# Patient Record
Sex: Female | Born: 1970 | Race: Black or African American | Hispanic: No | Marital: Married
Health system: Midwestern US, Community
[De-identification: ages and names within clinical notes are randomized; demographics above are authoritative.]

## PROBLEM LIST (undated history)

## (undated) DIAGNOSIS — C50912 Malignant neoplasm of unspecified site of left female breast: Principal | ICD-10-CM

## (undated) DIAGNOSIS — Z780 Asymptomatic menopausal state: Secondary | ICD-10-CM

## (undated) DIAGNOSIS — C779 Secondary and unspecified malignant neoplasm of lymph node, unspecified: Secondary | ICD-10-CM

## (undated) DIAGNOSIS — R0600 Dyspnea, unspecified: Secondary | ICD-10-CM

## (undated) DIAGNOSIS — R208 Other disturbances of skin sensation: Secondary | ICD-10-CM

## (undated) DIAGNOSIS — Z170 Estrogen receptor positive status [ER+]: Secondary | ICD-10-CM

## (undated) DIAGNOSIS — C50512 Malignant neoplasm of lower-outer quadrant of left female breast: Principal | ICD-10-CM

## (undated) DIAGNOSIS — Z853 Personal history of malignant neoplasm of breast: Secondary | ICD-10-CM

## (undated) DIAGNOSIS — R928 Other abnormal and inconclusive findings on diagnostic imaging of breast: Secondary | ICD-10-CM

## (undated) DIAGNOSIS — J45909 Unspecified asthma, uncomplicated: Secondary | ICD-10-CM

## (undated) DIAGNOSIS — C801 Malignant (primary) neoplasm, unspecified: Secondary | ICD-10-CM

## (undated) DIAGNOSIS — Z1231 Encounter for screening mammogram for malignant neoplasm of breast: Secondary | ICD-10-CM

## (undated) DIAGNOSIS — C50412 Malignant neoplasm of upper-outer quadrant of left female breast: Secondary | ICD-10-CM

## (undated) DIAGNOSIS — C50919 Malignant neoplasm of unspecified site of unspecified female breast: Secondary | ICD-10-CM

## (undated) DIAGNOSIS — R918 Other nonspecific abnormal finding of lung field: Secondary | ICD-10-CM

## (undated) HISTORY — PX: BREAST LUMPECTOMY: SHX2

---

## 2016-01-01 ENCOUNTER — Inpatient Hospital Stay: Admit: 2016-01-01

## 2016-01-01 ENCOUNTER — Encounter

## 2016-01-01 DIAGNOSIS — Z1231 Encounter for screening mammogram for malignant neoplasm of breast: Secondary | ICD-10-CM

## 2016-01-30 ENCOUNTER — Encounter

## 2016-02-03 ENCOUNTER — Inpatient Hospital Stay: Admit: 2016-02-03 | Attending: Specialist

## 2016-02-03 ENCOUNTER — Encounter

## 2016-02-03 DIAGNOSIS — R928 Other abnormal and inconclusive findings on diagnostic imaging of breast: Secondary | ICD-10-CM

## 2016-03-09 DIAGNOSIS — R928 Other abnormal and inconclusive findings on diagnostic imaging of breast: Secondary | ICD-10-CM

## 2016-03-10 ENCOUNTER — Inpatient Hospital Stay: Admit: 2016-03-10 | Payer: Self-pay

## 2016-03-17 ENCOUNTER — Encounter

## 2016-03-26 ENCOUNTER — Inpatient Hospital Stay: Admit: 2016-03-26 | Payer: PRIVATE HEALTH INSURANCE | Attending: Specialist

## 2016-03-26 DIAGNOSIS — C50912 Malignant neoplasm of unspecified site of left female breast: Secondary | ICD-10-CM

## 2016-03-26 MED ORDER — GADOBUTROL 1 MMOL/ML (604.72 MG/ML) IV
1 mmol/mL (604.72 mg/mL) | Freq: Once | INTRAVENOUS | Status: AC
Start: 2016-03-26 — End: 2016-03-26
  Administered 2016-03-26: 18:00:00 via INTRAVENOUS

## 2016-03-26 MED FILL — GADAVIST 1 MMOL/ML (604.72 MG/ML) INTRAVENOUS SOLUTION: 1 mmol/mL (604.72 mg/mL) | INTRAVENOUS | Qty: 7

## 2016-03-31 ENCOUNTER — Encounter

## 2016-04-03 ENCOUNTER — Inpatient Hospital Stay: Admit: 2016-04-03 | Payer: Self-pay

## 2016-04-03 DIAGNOSIS — C50912 Malignant neoplasm of unspecified site of left female breast: Secondary | ICD-10-CM

## 2016-04-03 LAB — METABOLIC PANEL, COMPREHENSIVE
ALT (SGPT): 14 U/L (ref 12–78)
AST (SGOT): 12 U/L — ABNORMAL LOW (ref 15–37)
Albumin: 3.6 gm/dl (ref 3.4–5.0)
Alk. phosphatase: 60 U/L (ref 45–117)
BUN: 10 mg/dl (ref 7–25)
Bilirubin, total: 0.4 mg/dl (ref 0.2–1.0)
CO2: 21 mEq/L (ref 21–32)
Calcium: 9.1 mg/dl (ref 8.5–10.1)
Chloride: 109 mEq/L — ABNORMAL HIGH (ref 98–107)
Creatinine: 0.8 mg/dl (ref 0.6–1.3)
GFR est AA: >60
GFR est non-AA: >60
Glucose: 78 mg/dl (ref 74–106)
Potassium: 4.5 mEq/L (ref 3.5–5.1)
Protein, total: 7.9 gm/dl (ref 6.4–8.2)
Sodium: 140 mEq/L (ref 136–145)

## 2016-04-04 LAB — CBC WITH AUTOMATED DIFF
BASOPHILS: 0.8 % (ref 0–3)
EOSINOPHILS: 1.7 % (ref 0–5)
HCT: 26.1 % — ABNORMAL LOW (ref 37.0–50.0)
HGB: 7.7 gm/dl — ABNORMAL LOW (ref 13.0–17.2)
IMMATURE GRANULOCYTES: 0.2 % (ref 0.0–3.0)
LYMPHOCYTES: 38.6 % (ref 28–48)
MCH: 17.2 pg — ABNORMAL LOW (ref 25.4–34.6)
MCHC: 29.5 gm/dl — ABNORMAL LOW (ref 30.0–36.0)
MCV: 58.4 fL — ABNORMAL LOW (ref 80.0–98.0)
MONOCYTES: 3.8 % (ref 1–13)
MPV: 9.4 fL (ref 6.0–10.0)
NEUTROPHILS: 54.9 % (ref 34–64)
NRBC: 0 (ref 0–0)
PLATELET: 649 10*3/uL — ABNORMAL HIGH (ref 140–450)
RBC: 4.47 M/uL (ref 3.60–5.20)
RDW-SD: 38.4 (ref 36.4–46.3)
WBC: 4.7 10*3/uL (ref 4.0–11.0)

## 2016-04-06 LAB — CANCER ANTIGEN (CA) 15-3: Cancer Antigen 15-3: 16 U/mL (ref <32)

## 2016-04-15 LAB — CA 27.29

## 2016-04-16 ENCOUNTER — Inpatient Hospital Stay: Admit: 2016-04-16 | Payer: Self-pay | Attending: Specialist

## 2016-04-16 ENCOUNTER — Encounter

## 2016-04-16 ENCOUNTER — Inpatient Hospital Stay: Payer: PRIVATE HEALTH INSURANCE

## 2016-04-16 ENCOUNTER — Ambulatory Visit: Admit: 2016-04-16 | Payer: PRIVATE HEALTH INSURANCE

## 2016-04-16 ENCOUNTER — Ambulatory Visit

## 2016-04-16 DIAGNOSIS — C50412 Malignant neoplasm of upper-outer quadrant of left female breast: Secondary | ICD-10-CM

## 2016-04-16 LAB — POC CHEM8
BUN: 5 mg/dl — ABNORMAL LOW (ref 7–25)
CALCIUM,IONIZED: 4.9 mg/dL (ref 4.40–5.40)
CO2, TOTAL: 23 mmol/L (ref 21–32)
Chloride: 105 mEq/L (ref 98–107)
Creatinine: 0.8 mg/dl (ref 0.6–1.3)
Glucose: 82 mg/dL (ref 74–106)
HCT: 29 % — ABNORMAL LOW (ref 38–45)
HGB: 9.9 gm/dl — ABNORMAL LOW (ref 13.0–17.2)
Potassium: 3.7 mEq/L (ref 3.5–4.9)
Sodium: 139 mEq/L (ref 136–145)

## 2016-04-16 LAB — POC HCG,URINE: HCG urine, QL: NEGATIVE

## 2016-04-16 MED ORDER — PROMETHAZINE 25 MG/ML INJECTION
25 mg/mL | INTRAMUSCULAR | Status: AC
Start: 2016-04-16 — End: 2016-04-16
  Administered 2016-04-16: 22:00:00 via INTRAMUSCULAR

## 2016-04-16 MED ORDER — TECHNETIUM TC 99M TILMANOCEPT IV KIT
Freq: Once | Status: AC
Start: 2016-04-16 — End: 2016-04-16
  Administered 2016-04-16: 16:00:00 via SUBCUTANEOUS

## 2016-04-16 MED ORDER — ONDANSETRON (PF) 4 MG/2 ML INJECTION
4 mg/2 mL | INTRAMUSCULAR | Status: AC
Start: 2016-04-16 — End: 2016-04-16
  Administered 2016-04-16: 22:00:00 via INTRAVENOUS

## 2016-04-16 MED ORDER — MIDAZOLAM 1 MG/ML IJ SOLN
1 mg/mL | INTRAMUSCULAR | Status: AC
Start: 2016-04-16 — End: ?

## 2016-04-16 MED ORDER — PROPOFOL 10 MG/ML IV EMUL
10 mg/mL | INTRAVENOUS | Status: AC
Start: 2016-04-16 — End: ?

## 2016-04-16 MED ORDER — SCOPOLAMINE (1.3-1.5) MG 72 HR TRANSDERM PATCH
1 mg over 3 days | TRANSDERMAL | Status: DC
Start: 2016-04-16 — End: 2016-04-16

## 2016-04-16 MED ORDER — MORPHINE 8 MG/ML SYRINGE
8 mg/mL | INTRAMUSCULAR | Status: DC | PRN
Start: 2016-04-16 — End: 2016-04-16

## 2016-04-16 MED ORDER — LIDOCAINE HCL 1 % (10 MG/ML) IJ SOLN
10 mg/mL (1 %) | Freq: Once | INTRAMUSCULAR | Status: AC
Start: 2016-04-16 — End: 2016-04-16
  Administered 2016-04-16: 15:00:00 via INTRADERMAL

## 2016-04-16 MED ORDER — FENTANYL CITRATE (PF) 50 MCG/ML IJ SOLN
50 mcg/mL | INTRAMUSCULAR | Status: DC | PRN
Start: 2016-04-16 — End: 2016-04-16
  Administered 2016-04-16 (×3): via INTRAVENOUS

## 2016-04-16 MED ORDER — OXYCODONE 5 MG TAB
5 mg | ORAL | Status: DC | PRN
Start: 2016-04-16 — End: 2016-04-16

## 2016-04-16 MED ORDER — FENTANYL CITRATE (PF) 50 MCG/ML IJ SOLN
50 mcg/mL | INTRAMUSCULAR | Status: AC
Start: 2016-04-16 — End: ?

## 2016-04-16 MED ORDER — KETOROLAC TROMETHAMINE 30 MG/ML INJECTION
30 mg/mL (1 mL) | INTRAMUSCULAR | Status: AC
Start: 2016-04-16 — End: ?

## 2016-04-16 MED ORDER — APREPITANT 80 MG CAP
80 mg | Freq: Once | ORAL | Status: DC
Start: 2016-04-16 — End: 2016-04-16
  Administered 2016-04-17: via ORAL

## 2016-04-16 MED ORDER — LABETALOL 5 MG/ML IV SOLN
5 mg/mL | INTRAVENOUS | Status: DC | PRN
Start: 2016-04-16 — End: 2016-04-16

## 2016-04-16 MED ORDER — ONDANSETRON (PF) 4 MG/2 ML INJECTION
4 mg/2 mL | INTRAMUSCULAR | Status: AC
Start: 2016-04-16 — End: ?

## 2016-04-16 MED ORDER — DIPHENHYDRAMINE HCL 50 MG/ML IJ SOLN
50 mg/mL | INTRAMUSCULAR | Status: AC
Start: 2016-04-16 — End: ?

## 2016-04-16 MED ORDER — PROPOFOL 10 MG/ML IV EMUL
10 mg/mL | INTRAVENOUS | Status: DC | PRN
Start: 2016-04-16 — End: 2016-04-16
  Administered 2016-04-16: 20:00:00 via INTRAVENOUS

## 2016-04-16 MED ORDER — DEXAMETHASONE SODIUM PHOSPHATE 4 MG/ML IJ SOLN
4 mg/mL | INTRAMUSCULAR | Status: DC | PRN
Start: 2016-04-16 — End: 2016-04-16
  Administered 2016-04-16: 20:00:00 via INTRAVENOUS

## 2016-04-16 MED ORDER — KETAMINE 50 MG/ML IJ SOLN
50 mg/mL | INTRAMUSCULAR | Status: DC | PRN
Start: 2016-04-16 — End: 2016-04-16
  Administered 2016-04-16 (×2): via INTRAVENOUS

## 2016-04-16 MED ORDER — HYDROCODONE-ACETAMINOPHEN 5 MG-325 MG TAB
5-325 mg | ORAL_TABLET | ORAL | 0 refills | Status: DC | PRN
Start: 2016-04-16 — End: 2017-04-01

## 2016-04-16 MED ORDER — NALOXONE 0.4 MG/ML INJECTION
0.4 mg/mL | INTRAMUSCULAR | Status: DC | PRN
Start: 2016-04-16 — End: 2016-04-16

## 2016-04-16 MED ORDER — ONDANSETRON (PF) 4 MG/2 ML INJECTION
4 mg/2 mL | INTRAMUSCULAR | Status: DC | PRN
Start: 2016-04-16 — End: 2016-04-16
  Administered 2016-04-16: 20:00:00 via INTRAVENOUS

## 2016-04-16 MED ORDER — LACTATED RINGERS IV
INTRAVENOUS | Status: DC | PRN
Start: 2016-04-16 — End: 2016-04-16
  Administered 2016-04-16: 20:00:00 via INTRAVENOUS

## 2016-04-16 MED ORDER — KETOROLAC TROMETHAMINE 30 MG/ML INJECTION
30 mg/mL (1 mL) | INTRAMUSCULAR | Status: DC | PRN
Start: 2016-04-16 — End: 2016-04-16
  Administered 2016-04-16: 21:00:00 via INTRAVENOUS

## 2016-04-16 MED ORDER — ONDANSETRON (PF) 4 MG/2 ML INJECTION
4 mg/2 mL | Freq: Once | INTRAMUSCULAR | Status: AC
Start: 2016-04-16 — End: 2016-04-16
  Administered 2016-04-16: 22:00:00 via INTRAVENOUS

## 2016-04-16 MED ORDER — FENTANYL CITRATE (PF) 50 MCG/ML IJ SOLN
50 mcg/mL | INTRAMUSCULAR | Status: DC | PRN
Start: 2016-04-16 — End: 2016-04-16
  Administered 2016-04-16 (×2): via INTRAVENOUS

## 2016-04-16 MED ORDER — GUM MASTIC-STORAX-METHYLSALICYLATE-ALCOHOL IN A DROPPERETTE
Status: AC
Start: 2016-04-16 — End: ?

## 2016-04-16 MED ORDER — FLUMAZENIL 0.1 MG/ML IV SOLN
0.1 mg/mL | INTRAVENOUS | Status: DC | PRN
Start: 2016-04-16 — End: 2016-04-16

## 2016-04-16 MED ORDER — MEPERIDINE (PF) 25 MG/ML INJ SOLUTION
25 mg/ml | INTRAMUSCULAR | Status: DC | PRN
Start: 2016-04-16 — End: 2016-04-16

## 2016-04-16 MED ORDER — DIPHENHYDRAMINE HCL 50 MG/ML IJ SOLN
50 mg/mL | INTRAMUSCULAR | Status: DC | PRN
Start: 2016-04-16 — End: 2016-04-16
  Administered 2016-04-16: 20:00:00 via INTRAVENOUS

## 2016-04-16 MED ORDER — DEXAMETHASONE SODIUM PHOSPHATE 4 MG/ML IJ SOLN
4 mg/mL | INTRAMUSCULAR | Status: AC
Start: 2016-04-16 — End: ?

## 2016-04-16 MED ORDER — BUPIVACAINE-EPINEPHRINE (PF) 0.25 %-1:200,000 IJ SOLN
0.25 %-1:200,000 | INTRAMUSCULAR | Status: AC
Start: 2016-04-16 — End: ?

## 2016-04-16 MED ORDER — CLINDAMYCIN IN D5W 900 MG/50 ML IV PIGGY BACK
900 mg/50 mL | Freq: Once | INTRAVENOUS | Status: AC
Start: 2016-04-16 — End: 2016-04-16
  Administered 2016-04-16: 20:00:00 via INTRAVENOUS

## 2016-04-16 MED ORDER — LIDOCAINE HCL 1 % (10 MG/ML) IJ SOLN
10 mg/mL (1 %) | INTRAMUSCULAR | Status: DC | PRN
Start: 2016-04-16 — End: 2016-04-16
  Administered 2016-04-16: 20:00:00 via SUBCUTANEOUS

## 2016-04-16 MED ORDER — PROMETHAZINE IN NS 12.5 MG/50 ML IV PIGGY BAG
12.5 mg/50 ml | INTRAVENOUS | Status: DC
Start: 2016-04-16 — End: 2016-04-16

## 2016-04-16 MED ORDER — KETAMINE 50 MG/ML (1 ML) INTRAVENOUS SYRINGE
50 mg/mL (1 mL) | INTRAVENOUS | Status: AC
Start: 2016-04-16 — End: ?

## 2016-04-16 MED ORDER — OXYCODONE 5 MG TAB
5 mg | Freq: Once | ORAL | Status: DC | PRN
Start: 2016-04-16 — End: 2016-04-16

## 2016-04-16 MED ORDER — MIDAZOLAM 1 MG/ML IJ SOLN
1 mg/mL | INTRAMUSCULAR | Status: DC | PRN
Start: 2016-04-16 — End: 2016-04-16
  Administered 2016-04-16: 20:00:00 via INTRAVENOUS

## 2016-04-16 MED ORDER — LACTATED RINGERS IV
INTRAVENOUS | Status: DC
Start: 2016-04-16 — End: 2016-04-16
  Administered 2016-04-16: 22:00:00 via INTRAVENOUS

## 2016-04-16 MED ORDER — HYDRALAZINE 20 MG/ML IJ SOLN
20 mg/mL | INTRAMUSCULAR | Status: DC | PRN
Start: 2016-04-16 — End: 2016-04-16

## 2016-04-16 MED ORDER — LIDOCAINE HCL 1 % (10 MG/ML) IJ SOLN
10 mg/mL (1 %) | INTRAMUSCULAR | Status: AC
Start: 2016-04-16 — End: ?

## 2016-04-16 MED FILL — CLINDAMYCIN IN D5W 900 MG/50 ML IV PIGGY BACK: 900 mg/50 mL | INTRAVENOUS | Qty: 50

## 2016-04-16 MED FILL — MIDAZOLAM 1 MG/ML IJ SOLN: 1 mg/mL | INTRAMUSCULAR | Qty: 2

## 2016-04-16 MED FILL — BUPIVACAINE-EPINEPHRINE (PF) 0.25 %-1:200,000 IJ SOLN: 0.25 %-1:200,000 | INTRAMUSCULAR | Qty: 30

## 2016-04-16 MED FILL — LIDOCAINE HCL 1 % (10 MG/ML) IJ SOLN: 10 mg/mL (1 %) | INTRAMUSCULAR | Qty: 20

## 2016-04-16 MED FILL — PROMETHAZINE 25 MG/ML INJECTION: 25 mg/mL | INTRAMUSCULAR | Qty: 1

## 2016-04-16 MED FILL — FENTANYL CITRATE (PF) 50 MCG/ML IJ SOLN: 50 mcg/mL | INTRAMUSCULAR | Qty: 2

## 2016-04-16 MED FILL — GUM MASTIC-STORAX-METHYLSALICYLATE-ALCOHOL IN A DROPPERETTE: Qty: 1

## 2016-04-16 MED FILL — ONDANSETRON (PF) 4 MG/2 ML INJECTION: 4 mg/2 mL | INTRAMUSCULAR | Qty: 2

## 2016-04-16 MED FILL — TRANSDERM-SCOP 1 MG OVER 3 DAYS TRANSDERMAL PATCH: 1 mg over 3 days | TRANSDERMAL | Qty: 1

## 2016-04-16 MED FILL — DIPRIVAN 10 MG/ML INTRAVENOUS EMULSION: 10 mg/mL | INTRAVENOUS | Qty: 20

## 2016-04-16 MED FILL — KETOROLAC TROMETHAMINE 30 MG/ML INJECTION: 30 mg/mL (1 mL) | INTRAMUSCULAR | Qty: 1

## 2016-04-16 MED FILL — DIPHENHYDRAMINE HCL 50 MG/ML IJ SOLN: 50 mg/mL | INTRAMUSCULAR | Qty: 1

## 2016-04-16 MED FILL — LACTATED RINGERS IV: INTRAVENOUS | Qty: 1000

## 2016-04-16 MED FILL — DEXAMETHASONE SODIUM PHOSPHATE 4 MG/ML IJ SOLN: 4 mg/mL | INTRAMUSCULAR | Qty: 5

## 2016-04-16 MED FILL — KETAMINE 50 MG/ML (1 ML) INTRAVENOUS SYRINGE: 50 mg/mL (1 mL) | INTRAVENOUS | Qty: 1

## 2016-04-16 NOTE — Other (Signed)
Patient's husband updated on PACU status.

## 2016-04-16 NOTE — Other (Signed)
Assisting pt while getting dressed. No further bleeding noted. Dermabond intact.

## 2016-04-16 NOTE — Op Note (Signed)
[facility]  Operation Report  NAME:  Carlson, Lynn  SEX:   F  DATE: 04/16/2016  DOB: 01/06/1971  MR#    [id number]  ROOM:  PL  ACCT#  [id number]        SURGEON:  Criss Alvine, MD    PREOPERATIVE DIAGNOSIS:  Left breast carcinoma.    POSTOPERATIVE DIAGNOSIS:  Left breast carcinoma.    PROCEDURES:  1.  Left partial mastectomy with needle localization.  2.  Sentinel node biopsy.    ANESTHESIA:  General.    COMPLICATIONS:  None.    CONDITION:  Stable to recovery.    PROCEDURE:  The patient was taken to the operating room and placed in supine position upon the operating table.  After adequate induction of general anesthetic, the patient was prepped and draped in the usual sterile fashion.  The patient was noted to have point uptake of the radio tracer in the axilla.  Axillary incision was made using a #15 blade.  Subcutaneous tissue was divided using electrocautery.  The dissection was taken down to the axillary fascia, which was carefully opened.  At this point, the Neoprobe identified 2 lymph nodes with elevated uptake.  These were dissected free from the surrounding structures, clamped using 3-0 Vicryl ties and sent for pathological evaluation with frozen section analysis.  Attention was turned to the breast.  A periareolar incision was made using a #15 blade.  Subcutaneous tissue was divided using electrocautery.  The wire was delivered into the wound.  This area was grasped with Allis clamps and dissected free from the surrounding structures using Metzenbaum scissors dissection.  The specimen was oriented with sutures and x-ray analysis revealed that the clip and wire were incorporated within the specimen.  The patient underwent reexcision of the margins.  Each margin was carefully reexcised and marked with ink.  The area was anesthetized with0.5% Marcaine.  Clips were placed at the borders of resection.  The wound was irrigated with normal saline.  Hemostasis was achieved using  electrocautery.  Breast tissue was approximated using 3-0 Vicryl and the skin was closed using 4-0 Vicryl subcuticular closure.  At this time, the pathologist reported that the sentinel nodes were negative for metastatic disease.  The subcutaneous tissues were approximated using 3-0 Vicryl, and the skin was closed using 4-0 Vicryl subcuticular closure.  Both wounds were covered with Dermabond.  Sponge, needle and instrument count was correct.      ___________________  Marvene Staff MD  Dictated By:.   [facility]  D:04/16/2016 23:08:01  T: 04/16/2016 23:24:05  [id number]

## 2016-04-16 NOTE — H&P (Signed)
Date of Surgery Update:  Lynn Carlson was seen and examined.  History and physical has been reviewed. The patient has been examined. There have been no significant clinical changes since the completion of the originally dated History and Physical.    Signed By: Marvene Staff, MD     April 16, 2016 2:12 PM

## 2016-04-16 NOTE — Other (Signed)
While taking EKG leads off pt, pt noticed bleeding from left areola at bottom of incision. Noticed small open area bleeding. Called and notified Dr. Vashti Hey. Order obtained. Pressure held to area for 5 minutes. Dermabond to site and let dried. Pt tolerated well.

## 2016-04-16 NOTE — H&P (Signed)
Breast localization    History: localization for surgery for suspicious lesion    PE:  NAD  RRR  CTA bilaterally    Impression:   Suspicious breast lesion for localization.    Plan:  Breast localization for surgery.  Please see full radiology report pending pathology.

## 2016-04-16 NOTE — Discharge Summary (Signed)
Breast localization procedure performed without complications.  Please see full radiology report pending pathology.  Transferred to surgery.  Follow up with referring provider regarding pathology and when to resume standing medications.

## 2016-04-16 NOTE — Procedures (Signed)
Breast localization procedure was tolerated well without complications.  Please see full radiology report pending pathology.

## 2016-04-16 NOTE — Anesthesia Post-Procedure Evaluation (Signed)
Post-Anesthesia Evaluation and Assessment    Patient: METTA PINGREE MRN: [id number]  SSN: [id number]    Date of Birth: 10/03/1970  Age: 46 y.o.  Sex: female       Cardiovascular Function/Vital Signs  Visit Vitals   ??? BP 147/80   ??? Pulse (!) 114   ??? Temp 36.6 ??C (97.8 ??F)   ??? Resp 26   ??? Ht 5\' 9"  (1.753 m)   ??? Wt 85.9 kg (189 lb 6 oz)   ??? SpO2 100%   ??? BMI 27.97 kg/m2       Patient is status post general anesthesia for Procedure(s):  NEEDLE LOCALIZE LUMPECTOMY LEFT BREAST; SENTINEL NODE BIOPSY; POSSIBLE AXILLARY DISSECTION.    Nausea/Vomiting: None    Postoperative hydration reviewed and adequate.    Pain:  Pain Scale 1: Numeric (0 - 10) (04/16/16 1702)  Pain Intensity 1: 8 (04/16/16 1702)   Managed    Neurological Status:   Neuro (WDL): Within Defined Limits (04/16/16 1641)   At baseline    Mental Status and Level of Consciousness: Arousable    Pulmonary Status:   O2 Device: Room air (04/16/16 1641)   Adequate oxygenation and airway patent    Complications related to anesthesia: None    Post-anesthesia assessment completed. No concerns    Signed By: Avel Peace, MD     April 16, 2016

## 2016-04-16 NOTE — Other (Signed)
DR. Vashti Hey in, aware of nausea in pacu, pts family in.

## 2016-04-16 NOTE — Op Note (Signed)
[place]  Operation Report  NAME:  Lynn Carlson, Lynn Carlson  SEX:   F  DATE: 04/16/2016  DOB: 1970-06-27  MR#    [id number]  ROOM:  PL  ACCT#  [id number]        SURGEON:  Criss Alvine, MD    PREOPERATIVE DIAGNOSIS:  Left breast carcinoma.    POSTOPERATIVE DIAGNOSIS:  Left breast carcinoma.    PROCEDURES:  1.  Left partial mastectomy with needle localization.  2.  Sentinel node biopsy.    ANESTHESIA:  General.    COMPLICATIONS:  None.    CONDITION:  Stable to recovery.    PROCEDURE:  The patient was taken to the operating room and placed in supine position upon the operating table.  After adequate induction of general anesthetic, the patient was prepped and draped in the usual sterile fashion.  The patient was noted to have point uptake of the radio tracer in the axilla.  Axillary incision was made using a #15 blade.  Subcutaneous tissue was divided using electrocautery.  The dissection was taken down to the axillary fascia, which was carefully opened.  At this point, the Neoprobe identified 2 lymph nodes with elevated uptake.  These were dissected free from the surrounding structures, clamped using 3-0 Vicryl ties and sent for pathological evaluation with frozen section analysis.  Attention was turned to the breast.  A periareolar incision was made using a #15 blade.  Subcutaneous tissue was divided using electrocautery.  The wire was delivered into the wound.  This area was grasped with Allis clamps and dissected free from the surrounding structures using Metzenbaum scissors dissection.  The specimen was oriented with sutures and x-ray analysis revealed that the clip and wire were incorporated within the specimen.  The patient underwent reexcision of the margins.  Each margin was carefully reexcised and marked with ink.  The area was anesthetized with0.5% Marcaine.  Clips were placed at the borders of resection.  The wound was irrigated with normal saline.  Hemostasis was achieved using electrocautery.   Breast tissue was approximated using 3-0 Vicryl and the skin was closed using 4-0 Vicryl subcuticular closure.  At this time, the pathologist reported that the sentinel nodes were negative for metastatic disease.  The subcutaneous tissues were approximated using 3-0 Vicryl, and the skin was closed using 4-0 Vicryl subcuticular closure.  Both wounds were covered with Dermabond.  Sponge, needle and instrument count was correct.      ___________________  Marvene Staff MD  Dictated By:.   [facility]  D:04/16/2016 23:08:01  T: 04/16/2016 23:24:05  [id number]

## 2016-04-16 NOTE — Other (Addendum)
Patient vomited about 64ml green bile to clear secretions. Patient stated she and pain medicine does not go well together. Relief verbalized after vomiting.   [id number] Patient with another episode of vomiting. 49ml of green bile emesis noted. Dr Argentina Ponder made aware and will be here at bedside.  1734 Patient sleeping at present. After nausea medications. Left undisturbed.  1745 Sleeping after nausea intervention but arouses easily. Some relief of nausea but not all. No emesis at this time. Will continue to monitor.  1759 Woke up vomiting. Emesis of about 28ml green bile secretions. Went back to sleep after vomiting.

## 2016-04-16 NOTE — Other (Signed)
When awakened pt states relief of nausea, pt rates her pain 8/10 then drifts back to sleep. Pts family at stretcher side.

## 2016-04-16 NOTE — Anesthesia Pre-Procedure Evaluation (Addendum)
Anesthetic History   No history of anesthetic complications            Review of Systems / Medical History  Patient summary reviewed and pertinent labs reviewed    Pulmonary            Asthma : well controlled       Neuro/Psych   Within defined limits           Cardiovascular  Within defined limits                Exercise tolerance: >4 METS     GI/Hepatic/Renal  Within defined limits              Endo/Other        Anemia     Other Findings                 Anesthetic Plan    ASA: 2  Anesthesia type: general          Induction: Intravenous  Anesthetic plan and risks discussed with: Patient

## 2016-04-16 NOTE — Brief Op Note (Signed)
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 Visvardis  Surg Asst-Relief: Madilyn Fireman  No case tracking events are documented in the log.  Anesthesia: General   Estimated Blood Loss: 40 ml  Specimens:   ID Type Source Tests Collected by Time Destination   1 : left axillary lymph node  #26 Tissue Lymph Node AP HISTOLOGY Marvene Staff, MD 04/16/2016 1518    2 : Left Axillary Lymph Node #0 Tissue Lymph Node AP HISTOLOGY Marvene Staff, MD 04/16/2016 1519    3 : left breast tissue Tissue Breast AP HISTOLOGY Marvene Staff, MD 04/16/2016 1549    4 : left breast tissue anterior margin Tissue Breast AP HISTOLOGY Marvene Staff, MD 04/16/2016 1551    5 : left breast tissue medial margin Tissue Breast AP HISTOLOGY Marvene Staff, MD 04/16/2016 1552    6 : left breast tissue superior margin Tissue Breast AP HISTOLOGY Marvene Staff, MD 04/16/2016 1553    7 : left breast tissue lateral Tissue Breast AP HISTOLOGY Marvene Staff, MD 04/16/2016 1554    8 : left breast tissue inferior margin Tissue Breast AP HISTOLOGY Marvene Staff, MD 04/16/2016 1555       Findings: clip in specimen, neg node  X 2   Complications: none  Implants: * No implants in log *

## 2016-04-17 MED ORDER — GUM MASTIC-STORAX-METHYLSALICYLATE-ALCOHOL IN A DROPPERETTE
Status: AC
Start: 2016-04-17 — End: ?

## 2016-04-17 MED FILL — GUM MASTIC-STORAX-METHYLSALICYLATE-ALCOHOL IN A DROPPERETTE: Qty: 1

## 2016-04-17 MED FILL — EMEND 80 MG CAPSULE: 80 mg | ORAL | Qty: 1

## 2016-04-19 MED FILL — DIPHENHYDRAMINE HCL 50 MG/ML IJ SOLN: 50 mg/mL | INTRAMUSCULAR | Qty: 12.5

## 2016-04-19 MED FILL — KETOROLAC TROMETHAMINE 30 MG/ML INJECTION: 30 mg/mL (1 mL) | INTRAMUSCULAR | Qty: 30

## 2016-04-19 MED FILL — DEXAMETHASONE SODIUM PHOSPHATE 4 MG/ML IJ SOLN: 4 mg/mL | INTRAMUSCULAR | Qty: 8

## 2016-04-19 MED FILL — LACTATED RINGERS IV: INTRAVENOUS | Qty: 700

## 2016-04-19 MED FILL — KETAMINE 50 MG/ML (1 ML) INTRAVENOUS SYRINGE: 50 mg/mL (1 mL) | INTRAVENOUS | Qty: 1

## 2016-04-19 MED FILL — PROPOFOL 10 MG/ML IV EMUL: 10 mg/mL | INTRAVENOUS | Qty: 20

## 2016-04-19 MED FILL — LIDOCAINE HCL 1 % (10 MG/ML) IJ SOLN: 10 mg/mL (1 %) | INTRAMUSCULAR | Qty: 50

## 2016-11-12 ENCOUNTER — Encounter

## 2017-01-04 ENCOUNTER — Inpatient Hospital Stay: Payer: PRIVATE HEALTH INSURANCE | Attending: Specialist

## 2017-01-25 ENCOUNTER — Inpatient Hospital Stay: Admit: 2017-01-25 | Payer: Self-pay

## 2017-01-25 ENCOUNTER — Encounter

## 2017-01-25 DIAGNOSIS — N644 Mastodynia: Secondary | ICD-10-CM

## 2017-02-02 ENCOUNTER — Encounter

## 2017-02-08 ENCOUNTER — Inpatient Hospital Stay: Admit: 2017-02-08 | Payer: Self-pay | Attending: Specialist

## 2017-02-08 DIAGNOSIS — N6489 Other specified disorders of breast: Secondary | ICD-10-CM

## 2017-02-08 MED ORDER — GADOBUTROL 1 MMOL/ML (604.72 MG/ML) IV
1 mmol/mL (604.72 mg/mL) | Freq: Once | INTRAVENOUS | Status: AC
Start: 2017-02-08 — End: 2017-02-08
  Administered 2017-02-08: 16:00:00 via INTRAVENOUS

## 2017-02-08 MED FILL — GADAVIST 1 MMOL/ML (604.72 MG/ML) INTRAVENOUS SOLUTION: 1 mmol/mL (604.72 mg/mL) | INTRAVENOUS | Qty: 8

## 2017-04-01 ENCOUNTER — Emergency Department: Admit: 2017-04-01 | Payer: Self-pay

## 2017-04-01 ENCOUNTER — Inpatient Hospital Stay
Admit: 2017-04-01 | Discharge: 2017-04-02 | Disposition: A | Discharge status: Home / Self Care | Payer: Self-pay | Attending: Emergency Medicine

## 2017-04-01 ENCOUNTER — Emergency Department

## 2017-04-01 DIAGNOSIS — R0600 Dyspnea, unspecified: Secondary | ICD-10-CM

## 2017-04-01 LAB — METABOLIC PANEL, BASIC
Anion gap: 8 mmol/L (ref 5–15)
BUN: 8 mg/dl (ref 7–25)
CO2: 24 mEq/L (ref 21–32)
Calcium: 9.3 mg/dl (ref 8.5–10.1)
Chloride: 108 mEq/L — ABNORMAL HIGH (ref 98–107)
Creatinine: 1 mg/dl (ref 0.6–1.3)
GFR est AA: >60
GFR est non-AA: >60
Glucose: 85 mg/dl (ref 74–106)
Potassium: 2.9 mEq/L — CL (ref 3.5–5.1)
Sodium: 139 mEq/L (ref 136–145)

## 2017-04-01 LAB — CBC WITH AUTOMATED DIFF
BASOPHILS: 0.4 % (ref 0–3)
EOSINOPHILS: 1.2 % (ref 0–5)
HCT: 36.2 % — ABNORMAL LOW (ref 37.0–50.0)
HGB: 12.9 gm/dl — ABNORMAL LOW (ref 13.0–17.2)
IMMATURE GRANULOCYTES: 0.4 % (ref 0.0–3.0)
LYMPHOCYTES: 20.1 % — ABNORMAL LOW (ref 28–48)
MCH: 30.4 pg (ref 25.4–34.6)
MCHC: 35.6 gm/dl (ref 30.0–36.0)
MCV: 85.4 fL (ref 80.0–98.0)
MONOCYTES: 3.9 % (ref 1–13)
MPV: 9.8 fL (ref 6.0–10.0)
NEUTROPHILS: 74 % — ABNORMAL HIGH (ref 34–64)
NRBC: 0 (ref 0–0)
PLATELET: 378 10*3/uL (ref 140–450)
RBC: 4.24 M/uL (ref 3.60–5.20)
RDW-SD: 42.5 (ref 36.4–46.3)
WBC: 7.4 10*3/uL (ref 4.0–11.0)

## 2017-04-01 LAB — TROPONIN I: Troponin-I: <0.015 ng/ml (ref 0.000–0.045)

## 2017-04-01 MED ORDER — DIPHENHYDRAMINE HCL 50 MG/ML IJ SOLN
50 mg/mL | INTRAMUSCULAR | Status: AC
Start: 2017-04-01 — End: 2017-04-01
  Administered 2017-04-01: via INTRAVENOUS

## 2017-04-01 MED ORDER — ONDANSETRON (PF) 4 MG/2 ML INJECTION
4 mg/2 mL | Freq: Once | INTRAMUSCULAR | Status: AC
Start: 2017-04-01 — End: 2017-04-01
  Administered 2017-04-01: 23:00:00 via INTRAVENOUS

## 2017-04-01 MED ORDER — POTASSIUM BICARBONATE-CITRIC ACID 25 MEQ EFFERVESCENT TAB
25 mEq | Freq: Two times a day (BID) | ORAL | Status: DC
Start: 2017-04-01 — End: 2017-04-02
  Administered 2017-04-01: 22:00:00 via ORAL

## 2017-04-01 MED ORDER — IOPAMIDOL 76 % IV SOLN
370 mg iodine /mL (76 %) | Freq: Once | INTRAVENOUS | Status: AC
Start: 2017-04-01 — End: 2017-04-01
  Administered 2017-04-01: via INTRAVENOUS

## 2017-04-01 MED ORDER — DEXAMETHASONE SODIUM PHOSPHATE 10 MG/ML IJ SOLN
10 mg/mL | Freq: Once | INTRAMUSCULAR | Status: DC
Start: 2017-04-01 — End: 2017-04-02

## 2017-04-01 MED FILL — ISOVUE-370  76 % INTRAVENOUS SOLUTION: 370 mg iodine /mL (76 %) | INTRAVENOUS | Qty: 80

## 2017-04-01 MED FILL — ONDANSETRON (PF) 4 MG/2 ML INJECTION: 4 mg/2 mL | INTRAMUSCULAR | Qty: 2

## 2017-04-01 MED FILL — DEXAMETHASONE SODIUM PHOSPHATE 10 MG/ML IJ SOLN: 10 mg/mL | INTRAMUSCULAR | Qty: 1

## 2017-04-01 MED FILL — POTASSIUM BICARBONATE-CITRIC ACID 25 MEQ EFFERVESCENT TAB: 25 mEq | ORAL | Qty: 2

## 2017-04-01 MED FILL — DIPHENHYDRAMINE HCL 50 MG/ML IJ SOLN: 50 mg/mL | INTRAMUSCULAR | Qty: 1

## 2017-04-01 NOTE — ED Provider Notes (Signed)
[place]  Emergency Department Treatment Report    Patient: Lynn Carlson Age: 47 y.o. Sex: female    Date of Birth: May 20, 1970 Admit Date: 04/01/2017 PCP: None   MRN: [id number]  CSN: [id number]  ATTENDING: Geradine Girt, MD   Room: ER30/ER30 Time Dictated: 4:55 PM APP: Hollie Salk, PA-C     Chief Complaint   Chief Complaint   Patient presents with   ??? Shortness of Breath       History of Present Illness   47 y.o. female with a history of breast cancer, on tamoxifen, presenting for evaluation of dyspnea with onset 5 days ago.  She denies chest pain, no cough or wheezing, reports that she has just been feeling increasingly winded, even with mild activity and with conversation.  She was at her oncologist's office today for a follow up and they urged her to come here for PE rule out.  Denies lower extremity swelling or pain.  No history of clots. Denies recent travel or immobilization.     Review of Systems   Constitutional:  No fever, chills, body aches  ENT: No sore throat or runny nose  Respiratory: +shortness of breath. No cough or wheezing  Cardiovascular: No chest pain or palpitations  Gastrointestinal: No abdominal pain, nausea, vomiting, diarrhea, or constipation  Genitourinary: No dysuria or frequency.  Musculoskeletal: No swelling or joint pain  Integumentary: No rashes or lesions  Hematologic: No bleeding/bruising complaints  Neurological: No headache or dizziness.    Past Medical/Surgical History     Past Medical History:   Diagnosis Date   ??? Asthma    ??? Breast CA ([place]) 2017   ??? Iron deficiency      Past Surgical History:   Procedure Laterality Date   ??? HX BREAST LUMPECTOMY Left 04/16/2016    NEEDLE LOCALIZE LUMPECTOMY LEFT BREAST; SENTINEL NODE BIOPSY; POSSIBLE AXILLARY DISSECTION performed by Marvene Staff, MD at [place] History     Social History     Socioeconomic History   ??? Marital status: MARRIED     Spouse name: Not on file    ??? Number of children: Not on file   ??? Years of education: Not on file   ??? Highest education level: Not on file   Tobacco Use   ??? Smoking status: Never Smoker   ??? Smokeless tobacco: Never Used   Substance and Sexual Activity   ??? Alcohol use: No   ??? Drug use: No   ??? Sexual activity: Yes       Family History     Family History   Problem Relation Age of Onset   ??? Cancer Father        Current Medications     Prior to Admission Medications   Prescriptions Last Dose Informant Patient Reported? Taking?   tamoxifen (NOLVADEX) 20 mg tablet   Yes Yes   Sig: Take 20 mg by mouth daily.      Facility-Administered Medications: None       Allergies     Allergies   Allergen Reactions   ??? Aspirin Not Reported This Time   ??? Onion Hives   ??? Penicillins Not Reported This Time       Physical Exam     ED Triage Vitals   ED Encounter Vitals Group      BP 04/01/17 1458 101/53      Pulse (Heart Rate) 04/01/17 1458 65  Resp Rate 04/01/17 1458 14      Temp 04/01/17 1514 97.7 ??F (36.5 ??C)      Temp src --       O2 Sat (%) 04/01/17 1458 100 %      Weight --       Height --      Constitutional: Pleasant female lying on stretcher, no distress.   HEENT: Conjunctiva clear.  PERRL. Mucous membranes moist, non-erythematous. Surface of the pharynx, palate, and tongue are pink, moist and without lesions.  Neck: supple, non tender, symmetrical, no masses, JVD, or lymphadenopathy   Respiratory: lungs clear to auscultation, nonlabored respirations. No tachypnea or accessory muscle use. She does get winded with short sentences.   Cardiovascular: heart regular rate and rhythm without murmur rubs or gallops.     Gastrointestinal:  Soft, non-tender, non-distended, normoactive bowel sounds  Musculoskeletal: Calves soft and non-tender. No peripheral edema or significant varicosities.  Pulses:  Radial and DP pulses 2+ and equal bilaterally.   Integumentary: warm and dry, no jaundice, no rashes or lesions     Impression and Management Plan    47 year old female on tamoxifen presenting for evaluation of dyspnea for the past 5 days.  On exam, O2 saturation is 100% on room air is within normal limits.  The lungs are clear to auscultation bilaterally, she has no peripheral edema no calf tenderness.  Will obtain CBC, BMP, troponin, chest x-ray, EKG, CTA chest to rule out PE.    Diagnostic Studies   Lab:   Recent Results (from the past 12 hour(s))   CBC WITH AUTOMATED DIFF    Collection Time: 04/01/17  3:27 PM   Result Value Ref Range    WBC 7.4 4.0 - 11.0 1000/mm3    RBC 4.24 3.60 - 5.20 M/uL    HGB 12.9 (L) 13.0 - 17.2 gm/dl    HCT 36.2 (L) 37.0 - 50.0 %    MCV 85.4 80.0 - 98.0 fL    MCH 30.4 25.4 - 34.6 pg    MCHC 35.6 30.0 - 36.0 gm/dl    PLATELET 378 140 - 450 1000/mm3    MPV 9.8 6.0 - 10.0 fL    RDW-SD 42.5 36.4 - 46.3      NRBC 0 0 - 0      IMMATURE GRANULOCYTES 0.4 0.0 - 3.0 %    NEUTROPHILS 74.0 (H) 34 - 64 %    LYMPHOCYTES 20.1 (L) 28 - 48 %    MONOCYTES 3.9 1 - 13 %    EOSINOPHILS 1.2 0 - 5 %    BASOPHILS 0.4 0 - 3 %   METABOLIC PANEL, BASIC    Collection Time: 04/01/17  3:30 PM   Result Value Ref Range    Sodium 139 136 - 145 mEq/L    Potassium 2.9 (LL) 3.5 - 5.1 mEq/L    Chloride 108 (H) 98 - 107 mEq/L    CO2 24 21 - 32 mEq/L    Glucose 85 74 - 106 mg/dl    BUN 8 7 - 25 mg/dl    Creatinine 1.0 0.6 - 1.3 mg/dl    GFR est AA >60.0      GFR est non-AA >60      Calcium 9.3 8.5 - 10.1 mg/dl    Anion gap 8 5 - 15 mmol/L   TROPONIN I    Collection Time: 04/01/17  3:30 PM   Result Value Ref Range    Troponin-I <0.015 0.000 - 0.045  ng/ml     Imaging:    Xr Chest Pa Lat    Result Date: 04/01/2017  EXAM: Frontal and lateral views of the chest. INDICATIONS: Dyspnea COMPARISON: Chest radiograph dated 04/16/2016. FINDINGS: No alveolar consolidations, congestive changes or pleural effusions. Cardiomediastinal silhouette normal in size and contour.     IMPRESSION: No acute cardiopulmonary process.      ECG: Reviewed by myself Dr. Oneita Hurt.  Sinus bradycardia, ventricular rate 59, PR interval 136, QRS 72, QTc 409.  No acute ischemic changes noted.    ED Course/Medical Decision Making   Patient's white blood cell count is normal at 7.4.  She is a very mild edema with a hemoglobin of 12.9 and hematocrit of 36.2.  Her potassium was found to be very low at 2.9, this was repleted orally during her stay in the ED.  Troponin is negative.  Renal function and remaining electrolytes all within normal limits.  Chest x-ray shows no acute cardiopulmonary process.  EKG sinus bradycardia without ischemic changes or T wave inversions noted.  Patient was transported for her CTA, upon arriving notified a CT tach that the patient gets hives with IV contrast in the past.  Nursing staff went to CT to medicate the patient with Decadron and Benadryl.    Patient was left in the care of Dr. Dyke Maes pending results of her CTA.    Continuation by Dr. Shirline Frees:  Patient signed out to me at change of the Dr. of Hermel[facility] Medicus down today.  Patient CT of the chest was negative for pulmonary embolism, pulmonary edema, or pneumonia.  Findings were discussed with the patient.  Unfortunately she has remained dyspneic.  Her O2 sats on room air are 100%.  She has no wheezing on exam.  We have a blighted the patient and her sats stayed around 98%.  I have offered the patient admission for echo and further workup, which she is declined.  I did review her other CT results with her including irregular density at the prior surgical site from her breast as well as her pulmonary nodules.  I have given her a copy of her CT, and I have recommended that she follow with Dr. Vashti Hey as soon as possible.  The patient declined admission and feels comfortable going home.  I have advised they return immediately with any worsening symptoms.  Patient discharged home in stable condition.          Final Diagnosis       ICD-10-CM ICD-9-CM    1. Dyspnea, unspecified type R06.00 786.09         Disposition   Patient discharged home in stable condition.     Dawn [place], PA-C  April 01, 2017    The patient was discussed with Dr. Hermelinda Medicus who agrees with the above assessment and plan.    My signature above authenticates this document and my orders, the final diagnosis (es), discharge prescription (s), and instructions in the Epic record. If you have any questions please contact [phone].  ??  Nursing notes have been reviewed by the physician/ advanced practice clinician.

## 2017-04-01 NOTE — ED Notes (Signed)
Pt lying calmly in bed, family at bedside, denies any needs at this time, no distress noted

## 2017-04-01 NOTE — ED Triage Notes (Signed)
Pt brought in by medics from oncologists office, went there for routine appt and they called ems due to patient having severe shortness of breath

## 2017-04-01 NOTE — ED Notes (Signed)
Bedside shift change report given to Catlyn (oncoming nurse) by Andee Poles (offgoing nurse). Report included the following information SBAR, ED Summary and MAR.

## 2017-04-01 NOTE — ED Notes (Signed)
Pregnancy test negative

## 2017-04-01 NOTE — ED Notes (Signed)
8:55 PM  04/01/17     Discharge instructions given to Lynn Carlson (name) with verbalization of understanding. Patient accompanied by spouse.  Patient discharged with the following prescriptions none. Patient discharged to home (destination).      Leeanne Deed, RN

## 2017-04-02 LAB — EKG, 12 LEAD, INITIAL
Atrial Rate: 59 {beats}/min
Calculated P Axis: 66 degrees
Calculated R Axis: 53 degrees
Calculated T Axis: 12 degrees
P-R Interval: 136 ms
Q-T Interval: 414 ms
QRS Duration: 72 ms
QTC Calculation (Bezet): 409 ms
Ventricular Rate: 59 {beats}/min

## 2017-04-02 LAB — EKG 12-LEAD
Atrial Rate: 59 {beats}/min
P Axis: 66 degrees
P-R Interval: 136 ms
Q-T Interval: 414 ms
QRS Duration: 72 ms
QTc Calculation (Bazett): 409 ms
R Axis: 53 degrees
T Axis: 12 degrees
Ventricular Rate: 59 {beats}/min

## 2017-06-15 ENCOUNTER — Encounter

## 2017-08-02 ENCOUNTER — Inpatient Hospital Stay: Payer: Self-pay | Attending: Specialist

## 2017-08-03 ENCOUNTER — Inpatient Hospital Stay: Admit: 2017-08-03 | Payer: Self-pay | Attending: Specialist

## 2017-08-03 ENCOUNTER — Encounter

## 2017-08-03 DIAGNOSIS — R928 Other abnormal and inconclusive findings on diagnostic imaging of breast: Secondary | ICD-10-CM

## 2018-01-11 ENCOUNTER — Encounter

## 2018-01-19 ENCOUNTER — Encounter

## 2018-01-31 ENCOUNTER — Inpatient Hospital Stay: Payer: Self-pay | Attending: Hematology & Oncology

## 2018-02-02 ENCOUNTER — Inpatient Hospital Stay: Admit: 2018-02-02 | Payer: Self-pay | Attending: Hematology & Oncology

## 2018-02-02 DIAGNOSIS — R918 Other nonspecific abnormal finding of lung field: Secondary | ICD-10-CM

## 2018-02-07 DIAGNOSIS — R5383 Other fatigue: Secondary | ICD-10-CM

## 2018-02-08 ENCOUNTER — Inpatient Hospital Stay: Admit: 2018-02-08 | Payer: Self-pay

## 2018-02-08 LAB — CBC WITH AUTOMATED DIFF
BASOPHILS: 0.6 % (ref 0–3)
EOSINOPHILS: 1.8 % (ref 0–5)
HCT: 30 % — ABNORMAL LOW (ref 37.0–50.0)
HGB: 9.7 gm/dl — ABNORMAL LOW (ref 13.0–17.2)
IMMATURE GRANULOCYTES: 0.3 % (ref 0.0–3.0)
LYMPHOCYTES: 23.6 % — ABNORMAL LOW (ref 28–48)
MCH: 29.4 pg (ref 25.4–34.6)
MCHC: 32.3 gm/dl (ref 30.0–36.0)
MCV: 90.9 fL (ref 80.0–98.0)
MONOCYTES: 4.4 % (ref 1–13)
MPV: 10 fL (ref 6.0–10.0)
NEUTROPHILS: 69.3 % — ABNORMAL HIGH (ref 34–64)
NRBC: 0 (ref 0–0)
PLATELET: 433 10*3/uL (ref 140–450)
RBC: 3.3 M/uL — ABNORMAL LOW (ref 3.60–5.20)
RDW-SD: 47.7 — ABNORMAL HIGH (ref 36.4–46.3)
WBC: 6.8 10*3/uL (ref 4.0–11.0)

## 2018-02-08 LAB — TIBC
TIBC: 344 ug/dL (ref 250–450)
TIBC: 344 ug/dL (ref 250–450)

## 2018-02-08 LAB — IRON
Iron: 36 ug/dL — ABNORMAL LOW (ref 50–170)
Iron: 36 ug/dL — ABNORMAL LOW (ref 50–170)

## 2018-02-08 LAB — FERRITIN
Ferritin: 33 ng/ml (ref 8.0–252.0)
Ferritin: 33 ng/ml (ref 8.0–252.0)

## 2018-02-08 LAB — CBC WITH AUTO DIFFERENTIAL
Basophils %: 0.6 % (ref 0–3)
Eosinophils %: 1.8 % (ref 0–5)
Hematocrit: 30 % — ABNORMAL LOW (ref 37.0–50.0)
Hemoglobin: 9.7 gm/dl — ABNORMAL LOW (ref 13.0–17.2)
Immature Granulocytes: 0.3 % (ref 0.0–3.0)
Lymphocytes %: 23.6 % — ABNORMAL LOW (ref 28–48)
MCH: 29.4 pg (ref 25.4–34.6)
MCHC: 32.3 gm/dl (ref 30.0–36.0)
MCV: 90.9 fL (ref 80.0–98.0)
MPV: 10 fL (ref 6.0–10.0)
Monocytes %: 4.4 % (ref 1–13)
Neutrophils %: 69.3 % — ABNORMAL HIGH (ref 34–64)
Nucleated RBCs: 0 (ref 0–0)
Platelets: 433 10*3/uL (ref 140–450)
RBC: 3.3 M/uL — ABNORMAL LOW (ref 3.60–5.20)
RDW-SD: 47.7 — ABNORMAL HIGH (ref 36.4–46.3)
WBC: 6.8 10*3/uL (ref 4.0–11.0)

## 2018-02-21 ENCOUNTER — Encounter

## 2018-04-06 ENCOUNTER — Inpatient Hospital Stay: Payer: BLUE CROSS/BLUE SHIELD | Attending: Hematology & Oncology

## 2018-04-14 ENCOUNTER — Inpatient Hospital Stay: Admit: 2018-04-14 | Payer: BLUE CROSS/BLUE SHIELD | Attending: Hematology & Oncology

## 2018-04-14 DIAGNOSIS — R918 Other nonspecific abnormal finding of lung field: Secondary | ICD-10-CM

## 2018-04-28 ENCOUNTER — Encounter

## 2018-08-08 ENCOUNTER — Inpatient Hospital Stay: Admit: 2018-08-08 | Payer: BLUE CROSS/BLUE SHIELD | Attending: Specialist

## 2018-08-08 DIAGNOSIS — Z1231 Encounter for screening mammogram for malignant neoplasm of breast: Secondary | ICD-10-CM

## 2018-10-02 ENCOUNTER — Emergency Department (HOSPITAL_BASED_OUTPATIENT_CLINIC_OR_DEPARTMENT_OTHER)
Admission: EM | Admit: 2018-10-02 | Discharge: 2018-10-02 | Disposition: A | Discharge status: Home / Self Care | Payer: BC Managed Care – PPO | Attending: Emergency Medicine | Admitting: Emergency Medicine

## 2018-10-02 ENCOUNTER — Emergency Department (HOSPITAL_BASED_OUTPATIENT_CLINIC_OR_DEPARTMENT_OTHER): Payer: BC Managed Care – PPO

## 2018-10-02 ENCOUNTER — Encounter (HOSPITAL_BASED_OUTPATIENT_CLINIC_OR_DEPARTMENT_OTHER): Payer: Self-pay | Admitting: Emergency Medicine

## 2018-10-02 ENCOUNTER — Other Ambulatory Visit: Payer: Self-pay

## 2018-10-02 DIAGNOSIS — J45909 Unspecified asthma, uncomplicated: Secondary | ICD-10-CM | POA: Insufficient documentation

## 2018-10-02 DIAGNOSIS — N939 Abnormal uterine and vaginal bleeding, unspecified: Secondary | ICD-10-CM | POA: Diagnosis present

## 2018-10-02 DIAGNOSIS — N390 Urinary tract infection, site not specified: Secondary | ICD-10-CM | POA: Diagnosis not present

## 2018-10-02 DIAGNOSIS — R55 Syncope and collapse: Secondary | ICD-10-CM | POA: Insufficient documentation

## 2018-10-02 HISTORY — DX: Unspecified asthma, uncomplicated: J45.909

## 2018-10-02 HISTORY — DX: Malignant (primary) neoplasm, unspecified: C80.1

## 2018-10-02 LAB — BASIC METABOLIC PANEL
Anion gap: 9 (ref 5–15)
BUN: 11 mg/dL (ref 6–20)
CO2: 21 mmol/L — ABNORMAL LOW (ref 22–32)
Calcium: 9.1 mg/dL (ref 8.9–10.3)
Chloride: 106 mmol/L (ref 98–111)
Creatinine, Ser: 0.86 mg/dL (ref 0.44–1.00)
GFR calc Af Amer: >60 mL/min (ref >60)
GFR calc non Af Amer: >60 mL/min (ref >60)
Glucose, Bld: 90 mg/dL (ref 70–99)
Potassium: 3.7 mmol/L (ref 3.5–5.1)
Sodium: 136 mmol/L (ref 135–145)

## 2018-10-02 LAB — CBC WITH DIFFERENTIAL/PLATELET
Abs Immature Granulocytes: 0.04 10*3/uL (ref 0.00–0.07)
Basophils Absolute: 0 10*3/uL (ref 0.0–0.1)
Basophils Relative: 0 %
Eosinophils Absolute: 0.1 10*3/uL (ref 0.0–0.5)
Eosinophils Relative: 1 %
HCT: 33.7 % — ABNORMAL LOW (ref 36.0–46.0)
Hemoglobin: 11.3 g/dL — ABNORMAL LOW (ref 12.0–15.0)
Immature Granulocytes: 0 %
Lymphocytes Relative: 25 %
Lymphs Abs: 2.4 10*3/uL (ref 0.7–4.0)
MCH: 29 pg (ref 26.0–34.0)
MCHC: 33.5 g/dL (ref 30.0–36.0)
MCV: 86.4 fL (ref 80.0–100.0)
Monocytes Absolute: 0.5 10*3/uL (ref 0.1–1.0)
Monocytes Relative: 6 %
Neutro Abs: 6.5 10*3/uL (ref 1.7–7.7)
Neutrophils Relative %: 68 %
Platelets: 369 10*3/uL (ref 150–400)
RBC: 3.9 MIL/uL (ref 3.87–5.11)
RDW: 13.2 % (ref 11.5–15.5)
WBC: 9.6 10*3/uL (ref 4.0–10.5)
nRBC: 0 % (ref 0.0–0.2)

## 2018-10-02 LAB — URINALYSIS, ROUTINE W REFLEX MICROSCOPIC
Bilirubin Urine: NEGATIVE
Glucose, UA: NEGATIVE mg/dL
Ketones, ur: NEGATIVE mg/dL
Nitrite: POSITIVE — AB
Protein, ur: NEGATIVE mg/dL
Specific Gravity, Urine: 1.01 (ref 1.005–1.030)
pH: 7 (ref 5.0–8.0)

## 2018-10-02 LAB — URINALYSIS, MICROSCOPIC (REFLEX)

## 2018-10-02 LAB — PREGNANCY, URINE: Preg Test, Ur: NEGATIVE

## 2018-10-02 MED ORDER — SULFAMETHOXAZOLE-TRIMETHOPRIM 800-160 MG PO TABS: 1.0000 | ORAL_TABLET | Freq: Two times a day (BID) | ORAL | 0 refills | Status: AC

## 2018-10-02 MED ORDER — SULFAMETHOXAZOLE-TRIMETHOPRIM 800-160 MG PO TABS
1.0000 | ORAL_TABLET | Freq: Once | ORAL | Status: AC
  Administered 2018-10-02: 1 via ORAL
  Filled 2018-10-02: qty 1

## 2018-10-02 MED ORDER — ONDANSETRON HCL 4 MG/2ML IJ SOLN
4.0000 mg | Freq: Once | INTRAMUSCULAR | Status: AC
  Administered 2018-10-02: 4 mg via INTRAVENOUS
  Filled 2018-10-02: qty 2

## 2018-10-02 MED ORDER — SODIUM CHLORIDE 0.9 % IV BOLUS
1000.0000 mL | Freq: Once | INTRAVENOUS | Status: AC
  Administered 2018-10-02: 1000 mL via INTRAVENOUS

## 2018-10-02 NOTE — ED Triage Notes (Addendum)
Vaginal bleeding since 10/19. Had IUD removed 1 week ago. She was given tranexamic acid. C/o feeling light headed and fatigued today.   Pt now adds she passed out today and broke the sink when she fell into it.

## 2018-10-02 NOTE — ED Notes (Signed)
Patient transported to CT 

## 2018-10-02 NOTE — ED Notes (Signed)
Attempted PIV x 2. Unsuccessful.

## 2018-10-02 NOTE — ED Notes (Signed)
Pt verbalized understanding of dc instructions.

## 2018-10-02 NOTE — ED Notes (Addendum)
C/O of nausea, waiting to go to CT

## 2018-10-02 NOTE — ED Provider Notes (Signed)
[facility] EMERGENCY DEPARTMENT Provider Note   CSN: [id number] Arrival date & time: 10/02/18  1104    History   Chief Complaint Chief Complaint  Patient presents with   Vaginal Bleeding   Loss of Consciousness    HPI Haley Lambert is a 48 y.o. female.     The history is provided by the patient.  Loss of Consciousness Episode history:  Single Most recent episode:  Today Progression:  Resolved Chronicity:  New Context: normal activity (Patient passed out this morning, unknown if hit her sink, feels light headed. Has chronic vaginal bleeding. Patient denies any dark stools or bloody stools. )   Context comment:  Patient with history of abnormal uterine bleeding for about the last year.  Had her IUD removed on Tuesday and was started on TXA as she continues to have daily vaginal bleeding.  Bleeding has been slightly worse over the last several days but has improved Relieved by:  Nothing Worsened by:  Nothing Associated symptoms: dizziness and malaise/fatigue   Associated symptoms: no chest pain, no fever, no palpitations, no seizures, no shortness of breath and no vomiting     Past Medical History:  Diagnosis Date   Asthma    Cancer ([place])     There are no active problems to display for this patient.   Past Surgical History:  Procedure Laterality Date   BREAST LUMPECTOMY       OB History   No obstetric history on file.      Home Medications    Prior to Admission medications   Medication Sig Start Date End Date Taking? Authorizing Provider  sulfamethoxazole-trimethoprim (BACTRIM DS) 800-160 MG tablet Take 1 tablet by mouth 2 (two) times daily for 5 days. 10/02/18 10/07/18  Lennice Sites, DO    Family History No family history on file.  Social History Social History   Tobacco Use   Smoking status: Never Smoker   Smokeless tobacco: Never Used  Substance Use Topics   Alcohol use: Not Currently   Drug use: Not on file      Allergies   Aspirin and Penicillins   Review of Systems Review of Systems  Constitutional: Positive for malaise/fatigue. Negative for chills and fever.  HENT: Negative for ear pain and sore throat.   Eyes: Negative for pain and visual disturbance.  Respiratory: Negative for cough and shortness of breath.   Cardiovascular: Positive for syncope. Negative for chest pain and palpitations.  Gastrointestinal: Negative for abdominal pain and vomiting.  Genitourinary: Positive for vaginal bleeding (chronic, no active major bleeding today). Negative for dysuria and hematuria.  Musculoskeletal: Negative for arthralgias and back pain.  Skin: Negative for color change and rash.  Neurological: Positive for dizziness and light-headedness. Negative for seizures and syncope.  All other systems reviewed and are negative.    Physical Exam Updated Vital Signs BP 104/73 (BP Location: Right Arm)    Pulse 60    Temp 98.2 F (36.8 C) (Oral)    Resp 16    Ht 5\' 9"  (1.753 m)    Wt 106.6 kg    SpO2 100%    BMI 34.70 kg/m   Physical Exam Vitals signs and nursing note reviewed.  Constitutional:      General: She is not in acute distress.    Appearance: She is well-developed. She is not ill-appearing.  HENT:     Head: Normocephalic and atraumatic.     Mouth/Throat:     Comments: Tongue in pale Eyes:  Extraocular Movements: Extraocular movements intact.     Pupils: Pupils are equal, round, and reactive to light.     Comments: Pale conjuncitiva  Neck:     Musculoskeletal: Normal range of motion and neck supple. No muscular tenderness.  Cardiovascular:     Rate and Rhythm: Normal rate and regular rhythm.     Pulses: Normal pulses.     Heart sounds: Normal heart sounds. No murmur.  Pulmonary:     Effort: Pulmonary effort is normal. No respiratory distress.     Breath sounds: Normal breath sounds.  Abdominal:     Palpations: Abdomen is soft.     Tenderness: There is no abdominal  tenderness.  Musculoskeletal:        General: No tenderness.     Comments: No midline spinal tenderness  Skin:    General: Skin is warm and dry.     Capillary Refill: Capillary refill takes less than 2 seconds.  Neurological:     General: No focal deficit present.     Mental Status: She is alert and oriented to person, place, and time.     Cranial Nerves: No cranial nerve deficit.     Sensory: No sensory deficit.     Motor: No weakness.     Coordination: Coordination normal.     Gait: Gait normal.     Comments: Normal speech, 5+ out of 5 strength throughout, normal sensation, no drift, normal finger-to-nose finger      ED Treatments / Results  Labs (all labs ordered are listed, but only abnormal results are displayed) Labs Reviewed  CBC WITH DIFFERENTIAL/PLATELET - Abnormal; Notable for the following components:      Result Value   Hemoglobin 11.3 (*)    HCT 33.7 (*)    All other components within normal limits  BASIC METABOLIC PANEL - Abnormal; Notable for the following components:   CO2 21 (*)    All other components within normal limits  URINALYSIS, ROUTINE W REFLEX MICROSCOPIC - Abnormal; Notable for the following components:   Hgb urine dipstick LARGE (*)    Nitrite POSITIVE (*)    Leukocytes,Ua TRACE (*)    All other components within normal limits  URINALYSIS, MICROSCOPIC (REFLEX) - Abnormal; Notable for the following components:   Bacteria, UA MANY (*)    All other components within normal limits  PREGNANCY, URINE    EKG EKG Interpretation  Date/Time:  Sunday October 02 2018 11:33:18 EDT Ventricular Rate:  68 PR Interval:    QRS Duration: 90 QT Interval:  382 QTC Calculation: 407 R Axis:   73 Text Interpretation:  Sinus rhythm Low voltage, precordial leads Confirmed by Lennice Sites [phone]) on 10/02/2018 11:36:11 AM   Radiology Ct Head Wo Contrast  Result Date: 10/02/2018 CLINICAL DATA:  Altered mental status. Syncopal episode resulting into a fall  onto the sink in a bathroom. No known head injury. EXAM: CT HEAD WITHOUT CONTRAST TECHNIQUE: Contiguous axial images were obtained from the base of the skull through the vertex without intravenous contrast. COMPARISON:  None. FINDINGS: Brain: Normal appearing cerebral hemispheres and posterior fossa structures. Normal size and position of the ventricles. No intracranial hemorrhage, mass lesion or CT evidence of acute infarction. Vascular: No hyperdense vessel or unexpected calcification. Skull: Normal. Negative for fracture or focal lesion. Sinuses/Orbits: Minimal left maxillary sinus mucosal thickening. Unremarkable orbits. Other: None. IMPRESSION: 1. No skull fracture or intracranial hemorrhage. 2. Minimal chronic left maxillary sinusitis. Electronically Signed   By: Percell Locus.D.  On: 10/02/2018 13:12    Procedures Procedures (including critical care time)  Medications Ordered in ED Medications  sodium chloride 0.9 % bolus 1,000 mL (0 mLs Intravenous Stopped 10/02/18 1358)  ondansetron (ZOFRAN) injection 4 mg (4 mg Intravenous Given 10/02/18 1229)  sulfamethoxazole-trimethoprim (BACTRIM DS) 800-160 MG per tablet 1 tablet (1 tablet Oral Given 10/02/18 1339)     Initial Impression / Assessment and Plan / ED Course  I have reviewed the triage vital signs and the nursing notes.  Pertinent labs & imaging results that were available during my care of the patient were reviewed by me and considered in my medical decision making (see chart for details).     Haley Lambert is a 48 year old female with history of chronic vaginal bleeding, breast cancer in remission on tamoxifen who presents to the ED after syncopal event.  Patient with normal vitals.  No fever.  Patient has had issues with abnormal uterine bleeding for the last year.  She had an IUD placed at that time and had it removed 1 week ago.  She has been on TXA since.  She has had slightly increased bleeding this past week with heavier  bleeding than normal yesterday.  She did not notice any major bleeding this morning or currently.  However she states that when she woke up today she felt more lightheaded and fatigued and she passed out at her hotel room.  She has no abdominal tenderness.  Neuro exam is unremarkable.  Patient still feels lightheaded while at rest.  She appears pale.  Concern for chronic blood loss anemia.  EKG shows sinus rhythm.  No ischemic changes.  Will get lab work including head CT to evaluate for syncope.  Will defer GU exam per patient request at this time as she does not admit any heavy bleeding at this time.  She denies any melena, hematochezia.  Hemoglobin is 11.3.  No significant leukocytosis.  Negative pregnancy test.  CT scan of the head is unremarkable.  Otherwise no significant electrolyte abnormality, kidney injury.  Urinalysis positive for UTI.  Patient feels better after IV fluids.  Given dose of Bactrim while in the ED.  Possible syncopal event in the setting of a UTI.  States that her blood pressure is usually low at baseline.  No concern for sepsis or systemic infection.  Patient was able to ambulate without any issues.  Discharged home in good condition.  Will give prescription for antibiotic.  Given return precautions.  This chart was dictated using voice recognition software.  Despite best efforts to proofread,  errors can occur which can change the documentation meaning.    Final Clinical Impressions(s) / ED Diagnoses   Final diagnoses:  Near syncope  Urinary tract infection without hematuria, site unspecified    ED Discharge Orders         Ordered    sulfamethoxazole-trimethoprim (BACTRIM DS) 800-160 MG tablet  2 times daily     10/02/18 1421           Lennice Sites, DO 10/02/18 1422

## 2019-04-21 ENCOUNTER — Encounter

## 2019-06-12 ENCOUNTER — Encounter

## 2019-06-21 ENCOUNTER — Inpatient Hospital Stay: Admit: 2019-06-21 | Payer: BLUE CROSS/BLUE SHIELD | Attending: Critical Care Medicine

## 2019-06-21 DIAGNOSIS — R918 Other nonspecific abnormal finding of lung field: Secondary | ICD-10-CM

## 2019-08-09 ENCOUNTER — Inpatient Hospital Stay: Admit: 2019-08-09 | Payer: BLUE CROSS/BLUE SHIELD | Attending: Specialist

## 2019-08-09 DIAGNOSIS — Z1231 Encounter for screening mammogram for malignant neoplasm of breast: Secondary | ICD-10-CM

## 2020-06-05 IMAGING — CT CT HEAD WITHOUT CONTRAST
3 series · 16 of 47 positions shown, 19 images · non-contrast
Comparison: None.

CLINICAL DATA: Altered mental status. Syncopal episode resulting
into a fall onto the sink in a bathroom. No known head injury.

EXAM:
CT HEAD WITHOUT CONTRAST
TECHNIQUE: Contiguous axial images were obtained from the base of the skull
through the vertex without intravenous contrast.

[Series 2: head wo · axial · 0.45mm/px · z∈[-155,-25]mm · 10 of 32 slices shown, 13 images]
[im 3/32  brain]
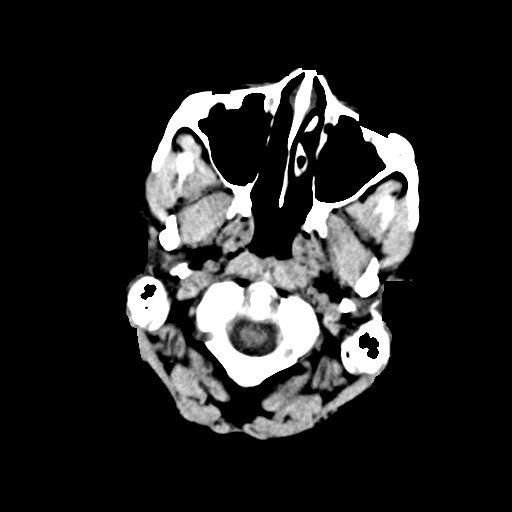
[im 3/32  bone]
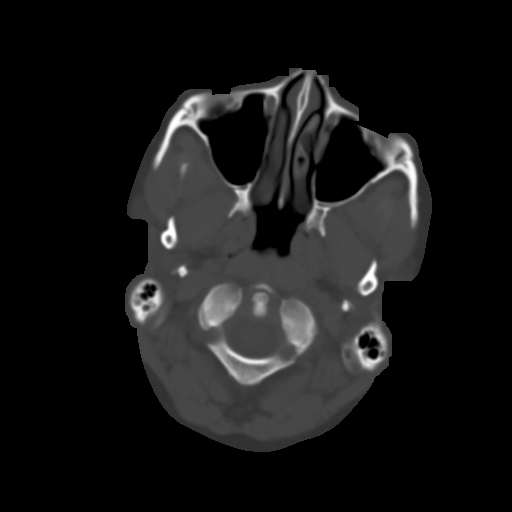
[im 6/32  brain]
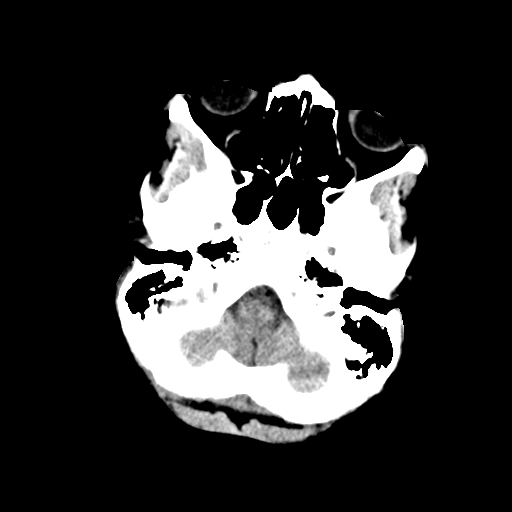
[im 9/32  brain]
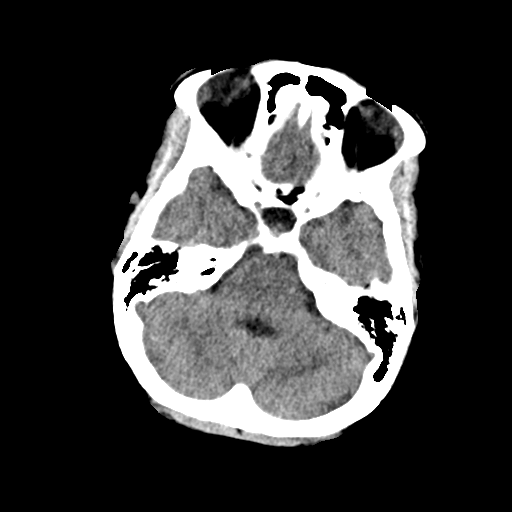
[im 11/32  brain]
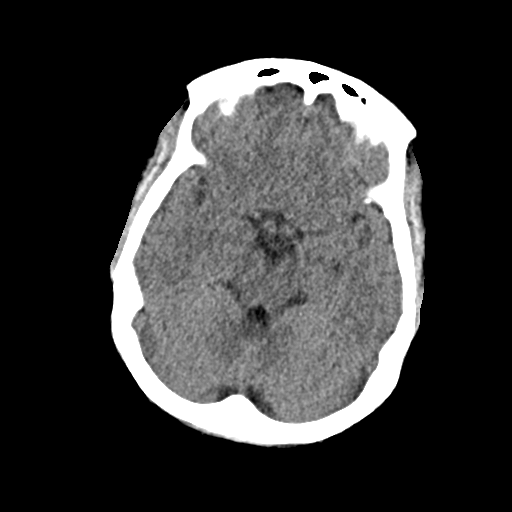
[im 14/32  brain]
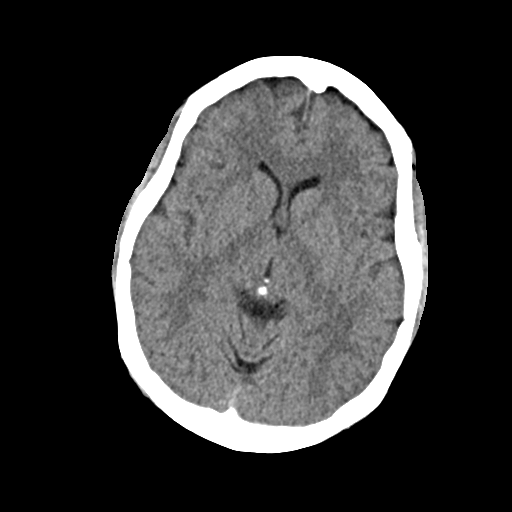
[im 14/32  bone]
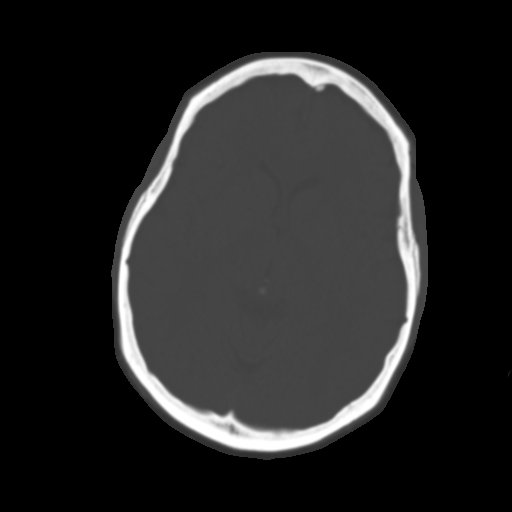
[im 18/32  brain]
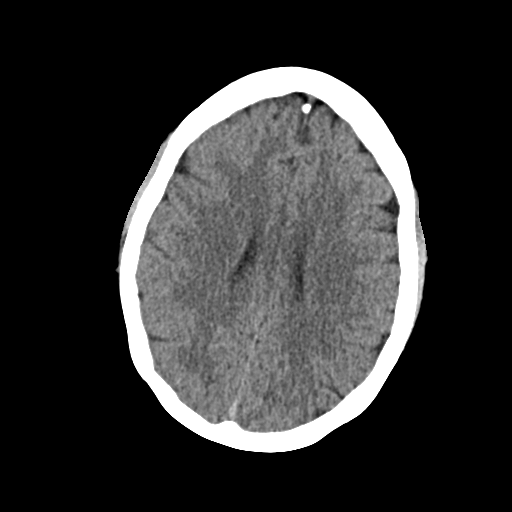
[im 21/32  brain]
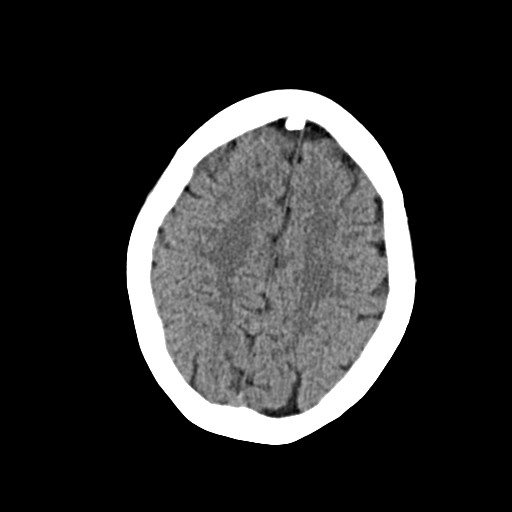
[im 24/32  brain]
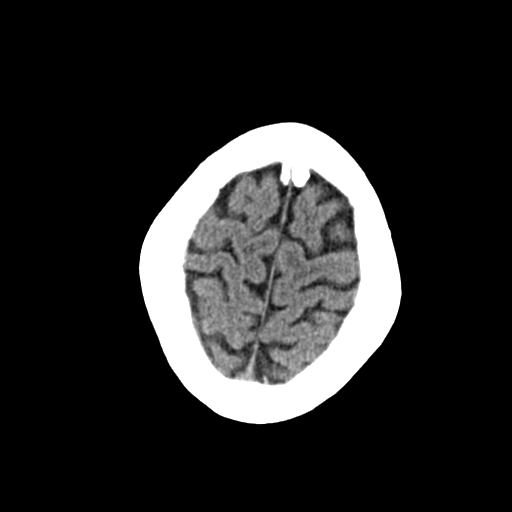
[im 26/32  brain]
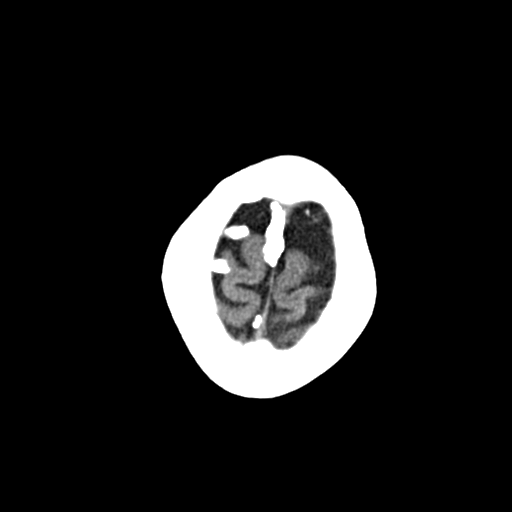
[im 26/32  bone]
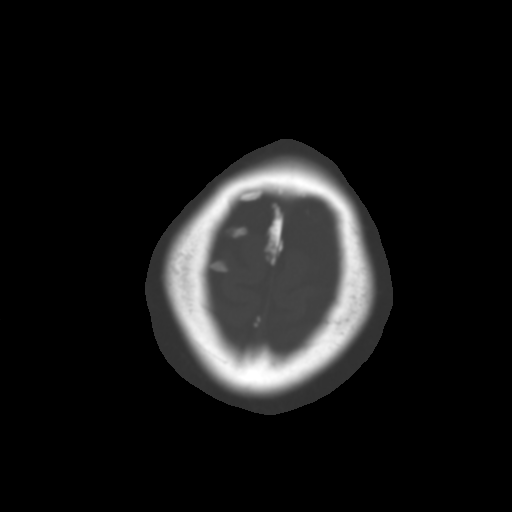
[im 29/32  brain]
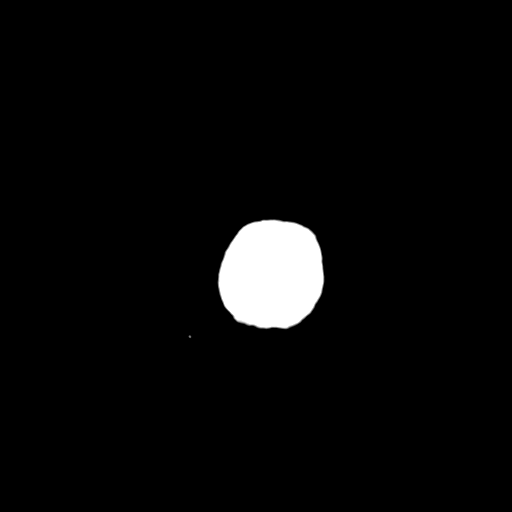

[Series 4: coronal soft · coronal · 0.31mm/px · 3 of 67 slices shown]
[im 23/67  brain]
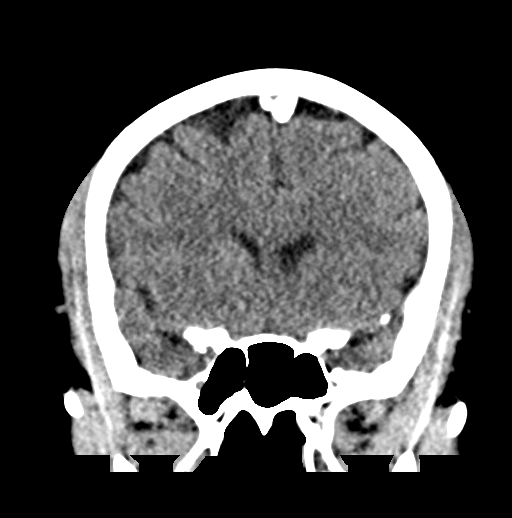
[im 30/67  brain]
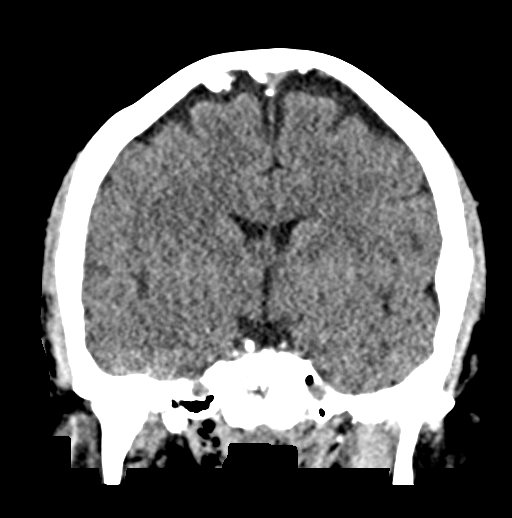
[im 37/67  brain]
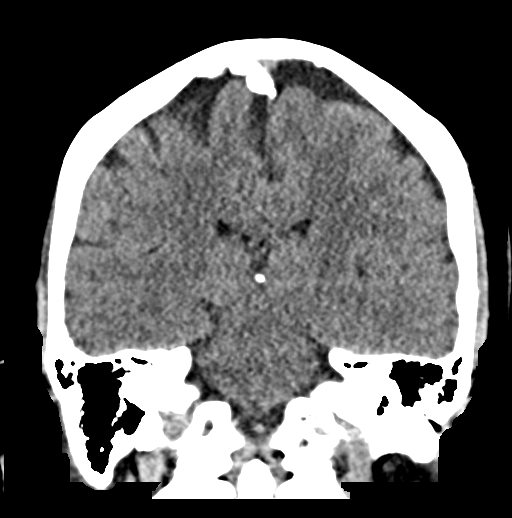

[Series 5: sag soft · sagittal · 0.32mm/px · 3 of 56 slices shown]
[im 19/56  brain]
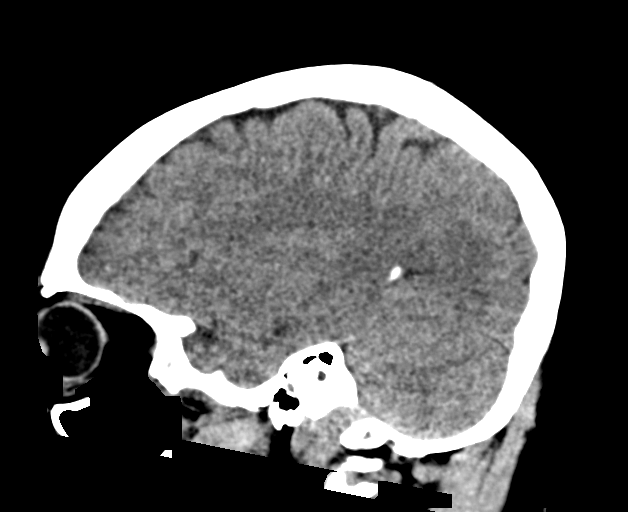
[im 28/56  brain]
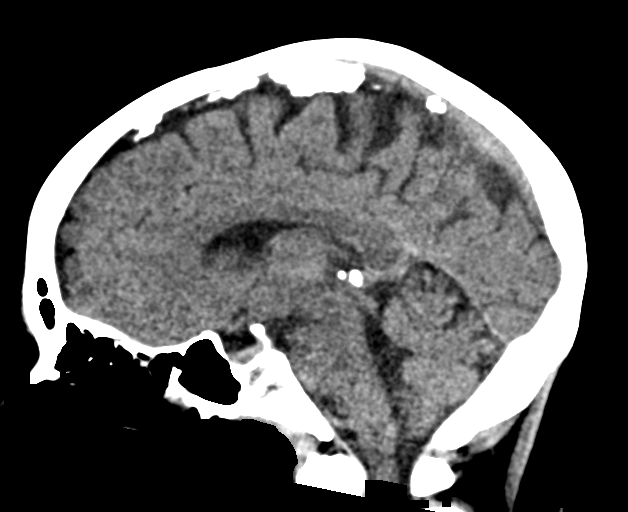
[im 37/56  brain]
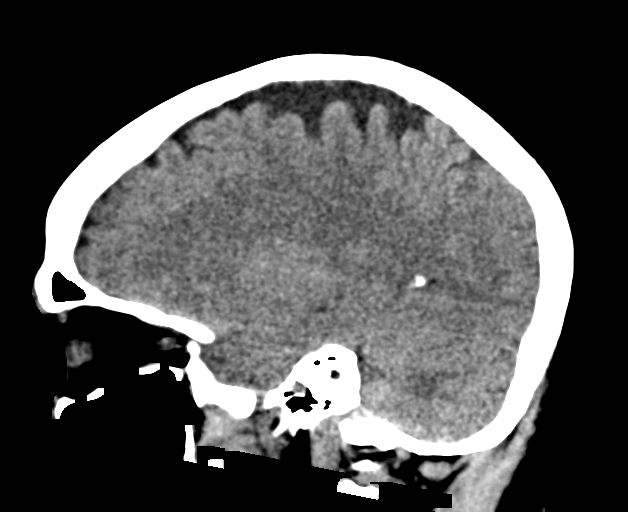

[16 of 47 positions shown; findings below may reference images not displayed]

FINDINGS: Brain: Normal appearing cerebral hemispheres and posterior fossa
structures. Normal size and position of the ventricles. No
intracranial hemorrhage, mass lesion or CT evidence of acute
infarction.

Vascular: No hyperdense vessel or unexpected calcification.

Skull: Normal. Negative for fracture or focal lesion.

Sinuses/Orbits: Minimal left maxillary sinus mucosal thickening.
Unremarkable orbits.

Other: None.
IMPRESSION: 1. No skull fracture or intracranial hemorrhage.
2. Minimal chronic left maxillary sinusitis.

## 2021-08-29 ENCOUNTER — Encounter

## 2021-09-10 ENCOUNTER — Inpatient Hospital Stay: Admit: 2021-09-10 | Payer: BLUE CROSS/BLUE SHIELD | Primary: Diagnostic Radiology

## 2021-09-10 DIAGNOSIS — N6323 Unspecified lump in the left breast, lower outer quadrant: Secondary | ICD-10-CM

## 2021-09-10 DIAGNOSIS — R928 Other abnormal and inconclusive findings on diagnostic imaging of breast: Secondary | ICD-10-CM

## 2021-09-16 ENCOUNTER — Inpatient Hospital Stay: Admit: 2021-09-16 | Payer: BLUE CROSS/BLUE SHIELD | Primary: Diagnostic Radiology

## 2021-09-16 ENCOUNTER — Inpatient Hospital Stay: Admit: 2021-09-17 | Payer: BLUE CROSS/BLUE SHIELD | Primary: Diagnostic Radiology

## 2021-09-16 ENCOUNTER — Encounter

## 2021-09-16 DIAGNOSIS — R928 Other abnormal and inconclusive findings on diagnostic imaging of breast: Secondary | ICD-10-CM

## 2021-09-19 ENCOUNTER — Ambulatory Visit: Primary: Diagnostic Radiology

## 2022-02-04 ENCOUNTER — Encounter

## 2022-02-05 ENCOUNTER — Encounter

## 2022-02-06 ENCOUNTER — Inpatient Hospital Stay: Admit: 2022-02-06 | Payer: BLUE CROSS/BLUE SHIELD | Attending: Specialist | Primary: Internal Medicine

## 2022-02-06 ENCOUNTER — Inpatient Hospital Stay: Admit: 2022-02-06 | Payer: BLUE CROSS/BLUE SHIELD | Primary: Internal Medicine

## 2022-02-06 ENCOUNTER — Inpatient Hospital Stay: Admit: 2022-02-06 | Payer: BLUE CROSS/BLUE SHIELD | Primary: Diagnostic Radiology

## 2022-02-06 DIAGNOSIS — C50912 Malignant neoplasm of unspecified site of left female breast: Secondary | ICD-10-CM

## 2022-02-06 DIAGNOSIS — C50512 Malignant neoplasm of lower-outer quadrant of left female breast: Secondary | ICD-10-CM

## 2022-02-06 DIAGNOSIS — Z170 Estrogen receptor positive status [ER+]: Secondary | ICD-10-CM

## 2022-02-06 MED ORDER — LIDOCAINE HCL 1 % IJ SOLN
1 % | Freq: Once | INTRAMUSCULAR | Status: AC
Start: 2022-02-06 — End: 2022-02-06
  Administered 2022-02-06: 19:00:00 10 mL via INTRADERMAL

## 2022-02-11 NOTE — Other (Signed)
PREOPERATIVE INSTRUCTIONS  Please Read Carefully    _0  Your procedure is scheduled on: 02/12/22     _1  The day before your surgery, call the surgeon's office to check on the surgery time and the time you should arrive.     _2  On the day of your surgery, arrive at the time given to you by your surgeon and check in at the first floor registration desk.    _3  DO NOT eat anything after midnight before your surgery. This includes gum, mints or hard candy.  May have Water, black coffee (NO CREAM) or regular Gatorade (not sugar free) up to 3 hours prior to surgery, but no more than 6 ounces per hour (approx.  cup).    _4  You may brush your teeth the morning of surgery, however DO NOT swallow any water.    _5  No smoking after midnight.  Smoking should be reduced a few days prior to surgery.    _6  If you are having an outpatient procedure, you must have a responsible adult bring you to the hospital.  They must remain in the surgical waiting area for the duration of your procedure and provide you with transportation home.  You may not drive yourself home.  We also recommend that you arrange for a responsible person to stay with you for 24 hours following your procedure.  Failure to comply with these instructions may result in cancellation of the procedure.    _7  A parent or legal guardian must accompany minors to the hospital.  LEGAL GUARDIANS MUST bring custody papers with them the day of surgery.    _8  If the lab gives you a blue blood ID band, do not throw it away.  Bring it with you on the day of surgery.      _9  Please return to preop surgical testing no more than 14 days before surgery date to have your type and screen done prior to surgery.    _10  If you have been diagnosed with Sleep Apnea and are using a CPAP, BiPAP, and/or Bi-Flex machine, please bring it with you the day of surgery.    _11  Endoscopy patients, please follow the instructions provided by the doctors office.    _12  If crutches are  ordered, you must get them and be instructed on proper use before the day of surgery.  Please bring them with you the day of surgery.      PREPARING THE SKIN BEFORE SURGERY Can Reduce the Risk of Infection  _13  You have been given a Chlorhexidine skin preparation packet with instructions to bathe the night before surgery and the morning of surgery prior to arrival.    _14  use Dial (antibacterial) soap to bathe your skin the night before surgery and the morning of surgery.    _15  Do not shave your face, underarms, legs or any part of your body at least 48 hours before surgery.  Any clipping needed for surgery will be done at the hospital.      [place]  _16  Wear casual, loose fitting comfortable clothes the day of surgery.    _17  Remove ALL jewelry, including body piercings.  Leave these and all valuables at home.  Leave suitcases and/or overnight bag at home or in the car for a family member to bring when you get a room.    _18  DO NOT wear any makeup, nail polish, deodorant, body lotion, aftershave or contact lenses the day of surgery.  Please  bring your glasses and glasses case with you the day of surgery.    _0  Bring any additional paperwork, such as doctors orders, consent form, POA (power of attorney), and/or advanced directives with you the day of surgery.    _1  Please notify your surgeon of any changes in your condition such as fever, sore throat or rash as soon as possible before your surgery date.  After office hours, call your surgeon's answering service.    _2 Bring picture identification and insurance cards with you the day of surgery.      MEDICATIONS:  _3  Stop taking aspirin and aspirin products/NSAIDs (non-steroidal anti-inflammatory drugs such as Motrin, Aleve, Advil, ibuprofen and BC Powder), vitamins and herbal pills 2 weeks before surgery OR as instructed by your doctor.  However, if you take a daily aspirin, please contact your primary care provider (PCP), for  instructions prior to surgery.    _4  Contact your doctor regarding any blood thinner medications you take (such as Coumadin, Plavix, etc.) for instructions about what to do prior to surgery.    _5 If you have diabetes, hold oral diabetic medications the night before surgery and the morning of surgery.  Hold insulin the morning of surgery unless told otherwise by your doctor.    _6  If you have asthma or use inhalers please bring them the day of surgery.    _7 Take the following medications (oral medications with a sip of water)  the day of surgery:       If needed:  Albuterol      Prior to Visit Medications    Medication Sig Taking? Authorizing Provider   ergocalciferol (ERGOCALCIFEROL) 1.25 MG (50000 UT) capsule Take 1 capsule by mouth once a week  [provider]   albuterol sulfate HFA (PROVENTIL;VENTOLIN;PROAIR) 108 (90 Base) MCG/ACT inhaler Inhale 2 puffs into the lungs every 6 hours as needed for Wheezing Yes [provider]   omeprazole (PRILOSEC) 20 MG delayed release capsule Take 1 capsule by mouth daily  [provider]        *Visitor Policy-Surgical/Procedural Areas: Two people may escort the patient to the department's waiting room and may stay there during the procedure.  One visitor must be 76 years of age or older and the patient must arrive with their post procedure transportation home before procedure start time. The post-procedure transporting party must remain within the surgical waiting room for the duration of the procedure. Failure may result in the cancellation of the procedure*  Surgical Admitting Unit (SAU)/Phase II/Post Anesthesia Care Unit (PACU): Patients are allowed one visitor at a time. Visitors must be 28 or older. Note: If the patient's time in PACU is expected to be less than one hour, visitation may occur in Phase II Recovery or the patient's room. Visitation is generally limited to 5 - 10 minutes. Pediatric patients are allowed two visitors to  accommodate both parents if the patient is hemodynamically stable.      We want you to have a positive experience at [facility]  If any of these these instructions are not met, it is possible that your surgery will be canceled.  If you have any questions regarding your surgery, please call [facility] at [phone] or your surgeon's office for further assistance.           [facility]    [facility] Anesthesia     There are many ways to perform anesthesia for surgery.  The 2 techniques are generally classified as General Anesthesia  and Regional Anesthesia.  While both techniques are very safe, they are distinctly different.  As with any anesthesia, there are risks, which may be increased if you already have heart disease, chronic lung conditions, or other serious medical problems.    General anesthesia puts you to sleep for your surgery.  It acts on your brain and nerves, and affects your entire body.  It may be administered by an injection, or through inhaling medication.  After you are asleep, a breathing tube may be placed in your windpipe to help you breathe during surgery.  General anesthesia may be more appropriate for longer or more involved surgery, especially if the position you will be in surgery is uncomfortable.  With general anesthesia you may experience a sore throat and hoarse voice for a few days, headache, nausea/vomiting, drowsiness, or blood pressure and breathing problems.    Regional anesthesia involves blocking the nerves to a specific area of the body with local anesthesia medication ("numbing medicine").  It is usually given in conjunction with varying degrees of twilight sedation.  When at all possible, the recommended type of anesthesia for both total knee replacement (TKA) and total hip replacement (THA) is a regional anesthesia technique.  This can be achieved utilizing a spinal block technique, or third placement of an epidural catheter.  In addition, your  anesthesiologist may recommend that you have a peripheral nerve block placed to help with pain after the procedure.    -Spinal block-In a spinal block, local anesthetic (i.e. numbing medicine) is injected into the fluid that baths the spinal cord in the lower part of your back.  This produces a rapid numbing effect that wears off in a few hours.  After meeting the anesthesiologist and discussing her medical conditions and history, the anesthesiologist may decide to place a long-acting pain medicine as well.  There are some medical conditions and home medications that may preclude a spinal block.    -Epidural block-An epidural block uses a catheter inserted into your lower back to deliver numbing medicine.  The epidural block in the spinal block are administered in a very similar location and technique; however, the epidural catheter is placed in a slightly different area around the spine as compared to a spinal block.    -Peripheral nerve block (PNB)-For TKA, the anesthesiologist may discuss performing a PNB.  This technique places local anesthetic directly around the major nerves in your thigh, and one or multiple locations.  These blocks numb only the leg that is injected, and do not affect the other leg.  PNB's are used in addition to another anesthesia technique, and are for postoperative pain relief only.    The advantages of regional anesthesia for joint replacement surgery are considered to be significant.  There is a significant reduction in blood loss and blood transfusions, less nausea/vomiting, less drowsiness, improved pain control after surgery, better diabetes control, and less association with infection.  Additionally, there also are reduced risks (as compared to general anesthesia techniques) with serious medical complications such as heart attack, stroke, pneumonia, respiratory depression, or development of a blood clot in your legs that can go to your lungs.  Like with any technique, there are  risks.  With regional anesthesia, there is a low incidence of headache and nerve damage.  Most commonly though, if the regional technique proves challenging, it is a difficulty in getting the numbing medicine in close proximity to the nerves perform the nerve block.  Extremely rarely (1 in 200,000), there  can be a collection of blood from around the nerves.  Some patients may experience difficulty voiding (passing urine) for a period of time after a regional technique.

## 2022-02-11 NOTE — Anesthesia Pre-Procedure Evaluation (Addendum)
Department of Anesthesiology  Preprocedure Note       Name:  Lynn Carlson   Age:  51 y.o.  DOB:  [birth year]                                          MRN:  [id number]         Date:  02/11/2022      Surgeon: Juliann Mule):  Alvy Bimler, MD    Procedure: Procedure(s):  PARTIAL MASTECTOMY LOCALIZED LEFT BREAST WITH SENTINEL LYMPH NODE BIOPSY; POSSIBLE AXILLARY LYMPH NODE DISSECTION    Medications prior to admission:   Prior to Admission medications    Medication Sig Start Date End Date Taking? Authorizing Provider   albuterol sulfate HFA (PROVENTIL;VENTOLIN;PROAIR) 108 (90 Base) MCG/ACT inhaler Inhale 2 puffs into the lungs every 6 hours as needed for Wheezing    [provider]   HYDROcodone-acetaminophen (NORCO) 5-325 MG per tablet Take 1 tablet by mouth every 6 hours as needed for Pain. Max Daily Amount: 4 tablets    [provider]   medroxyPROGESTERone (PROVERA) 10 MG tablet Take 1 tablet by mouth daily    [provider]   omeprazole (PRILOSEC) 20 MG delayed release capsule Take 1 capsule by mouth daily    [provider]   phentermine (ADIPEX-P) 37.5 MG tablet Take 1 tablet by mouth every morning (before breakfast). Max Daily Amount: 37.5 mg    [provider]   topiramate (TOPAMAX) 25 MG tablet Take 1 tablet by mouth daily    [provider]   venlafaxine (EFFEXOR) 37.5 MG tablet Take 1 tablet by mouth daily    [provider]   tamoxifen (NOLVADEX) 20 MG tablet Take 20 mg by mouth daily    Automatic Reconciliation, Ar       Current medications:    No current facility-administered medications for this encounter.     Current Outpatient Medications   Medication Sig Dispense Refill   . albuterol sulfate HFA (PROVENTIL;VENTOLIN;PROAIR) 108 (90 Base) MCG/ACT inhaler Inhale 2 puffs into the lungs every 6 hours as needed for Wheezing     . HYDROcodone-acetaminophen (NORCO) 5-325 MG per tablet Take 1 tablet by mouth every 6 hours as needed for Pain.  Max Daily Amount: 4 tablets     . medroxyPROGESTERone (PROVERA) 10 MG tablet Take 1 tablet by mouth daily     . omeprazole (PRILOSEC) 20 MG delayed release capsule Take 1 capsule by mouth daily     . phentermine (ADIPEX-P) 37.5 MG tablet Take 1 tablet by mouth every morning (before breakfast). Max Daily Amount: 37.5 mg     . topiramate (TOPAMAX) 25 MG tablet Take 1 tablet by mouth daily     . venlafaxine (EFFEXOR) 37.5 MG tablet Take 1 tablet by mouth daily     . tamoxifen (NOLVADEX) 20 MG tablet Take 20 mg by mouth daily         Allergies:    Allergies   Allergen Reactions   . Aspirin      Other reaction(s): Not Reported This Time   . Iodinated Contrast Media Hives   . Onion Hives   . Penicillins      Other reaction(s): Not Reported This Time       Problem List:  There is no problem list on file for this Carlson.      Past  Medical History:        Diagnosis Date   . Acid reflux    . Anemia    . Asthma    . Breast CA ([facility]) 2017   . 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 Iron deficiency        Past Surgical History:        Procedure Laterality Date   . BREAST LUMPECTOMY Left 04/16/2016    NEEDLE LOCALIZE LUMPECTOMY LEFT BREAST; SENTINEL NODE BIOPSY; POSSIBLE AXILLARY DISSECTION performed by Marvene Staff, MD at [facility] History:    Social History     Tobacco Use   . Smoking status: Never   . Smokeless tobacco: Never   Substance Use Topics   . Alcohol use: No                                Counseling given: Not Answered      Vital Signs (Current): There were no vitals filed for this visit.                                           BP Readings from Last 3 Encounters:   No data found for BP       NPO Status:                                                                                 BMI:   Wt Readings from Last 3 Encounters:   No data found for Wt     There is no height or weight on file to calculate BMI.    CBC:   Lab Results   Component Value Date/Time    WBC 6.8 02/07/2018 08:32 PM     RBC 3.30 02/07/2018 08:32 PM    HGB 9.7 02/07/2018 08:32 PM    HCT 30.0 02/07/2018 08:32 PM    MCV 90.9 02/07/2018 08:32 PM    PLT 433 02/07/2018 08:32 PM       CMP: No results found for: "NA", "K", "CL", "CO2", "BUN", "CREATININE", "GFRAA", "AGRATIO", "LABGLOM", "GLUCOSE", "GLU", "PROT", "CALCIUM", "BILITOT", "ALKPHOS", "AST", "ALT"    POC Tests: No results for input(s): "POCGLU", "POCNA", "POCK", "POCCL", "POCBUN", "POCHEMO", "POCHCT" in the last 72 hours.    Coags: No results found for: "PROTIME", "INR", "APTT"    HCG (If Applicable): No results found for: "PREGTESTUR", "PREGSERUM", "HCG", "HCGQUANT"     ABGs: No results found for: "PHART", "PO2ART", "PCO2ART", "HCO3ART", "BEART", "O2SATART"     Type & Screen (If Applicable):  No results found for: "LABABO", "LABRH"    Drug/Infectious Status (If Applicable):  No results found for: "HIV", "HEPCAB"    COVID-19 Screening (If Applicable): No results found for: "COVID19"        Anesthesia Evaluation  Carlson summary reviewed and Nursing notes reviewed   history of anesthetic complications: PONV.  Airway: Mallampati: II          Dental: normal  exam         Pulmonary:normal exam    (+) asthma:     (-) not a current smoker                           Cardiovascular:  Exercise tolerance: good (>4 METS),       (-) hypertension, valvular problems/murmurs, past MI, CAD, CABG/stent and dysrhythmias    ECG reviewed  Rhythm: regular  Rate: normal                    Neuro/Psych:      (-) seizures and CVA           GI/Hepatic/Renal:   (+) GERD:,           Endo/Other:    (+) blood dyscrasia: anemia:., malignancy/cancer (Breast ).    (-) diabetes mellitus, hypothyroidism               Abdominal:   (+) obese,           Vascular:          Other Findings:           Anesthesia Plan      general     ASA 2       Induction: intravenous.    MIPS: Postoperative opioids intended and Prophylactic antiemetics administered.  Anesthetic plan and risks discussed with Carlson.      Plan discussed  with CRNA.    Attending anesthesiologist reviewed and agrees with Preprocedure content                [place], DO   02/11/2022

## 2022-02-12 ENCOUNTER — Inpatient Hospital Stay: Admit: 2022-02-12 | Payer: BLUE CROSS/BLUE SHIELD | Attending: Specialist | Primary: Internal Medicine

## 2022-02-12 ENCOUNTER — Inpatient Hospital Stay: Payer: BLUE CROSS/BLUE SHIELD | Attending: Specialist

## 2022-02-12 DIAGNOSIS — C50512 Malignant neoplasm of lower-outer quadrant of left female breast: Secondary | ICD-10-CM

## 2022-02-12 DIAGNOSIS — Z170 Estrogen receptor positive status [ER+]: Secondary | ICD-10-CM

## 2022-02-12 MED ORDER — HYDROMORPHONE HCL 1 MG/ML IJ SOLN
1 MG/ML | INTRAMUSCULAR | Status: DC | PRN
Start: 2022-02-12 — End: 2022-02-12
  Administered 2022-02-12 (×2): 0.5 mg via INTRAVENOUS

## 2022-02-12 MED ORDER — DEXAMETHASONE SODIUM PHOSPHATE 20 MG/5ML IJ SOLN
20 MG/5ML | INTRAMUSCULAR | Status: AC
Start: 2022-02-12 — End: ?

## 2022-02-12 MED ORDER — BUPIVACAINE-EPINEPHRINE (PF) 0.5% -1:200000 IJ SOLN
INTRAMUSCULAR | Status: AC
Start: 2022-02-12 — End: ?

## 2022-02-12 MED ORDER — LIDOCAINE HCL 1 % IJ SOLN
1 % | INTRAMUSCULAR | Status: AC
Start: 2022-02-12 — End: ?

## 2022-02-12 MED ORDER — TECHNETIUM TC 99M TILMANOCEPT IV KIT
Freq: Once | Status: AC | PRN
Start: 2022-02-12 — End: 2022-02-16
  Administered 2022-02-12: 14:00:00 0.554 via SUBCUTANEOUS

## 2022-02-12 MED ORDER — NORMAL SALINE FLUSH 0.9 % IV SOLN
0.9 % | INTRAVENOUS | Status: DC | PRN
Start: 2022-02-12 — End: 2022-02-13

## 2022-02-12 MED ORDER — PROPOFOL 200 MG/20ML IV EMUL
200 MG/20ML | INTRAVENOUS | Status: AC
Start: 2022-02-12 — End: ?

## 2022-02-12 MED ORDER — NORMAL SALINE FLUSH 0.9 % IV SOLN
0.9 % | INTRAVENOUS | Status: DC | PRN
Start: 2022-02-12 — End: 2022-02-12

## 2022-02-12 MED ORDER — CLINDAMYCIN PHOSPHATE IN D5W 600 MG/50ML IV SOLN
600 MG/50ML | Freq: Three times a day (TID) | INTRAVENOUS | Status: DC
Start: 2022-02-12 — End: 2022-02-12

## 2022-02-12 MED ORDER — FENTANYL CITRATE (PF) 100 MCG/2ML IJ SOLN
100 MCG/2ML | INTRAMUSCULAR | Status: AC
Start: 2022-02-12 — End: ?

## 2022-02-12 MED ORDER — FENTANYL CITRATE (PF) 100 MCG/2ML IJ SOLN
100 MCG/2ML | INTRAMUSCULAR | Status: DC | PRN
Start: 2022-02-12 — End: 2022-02-12
  Administered 2022-02-12 (×4): 25 via INTRAVENOUS

## 2022-02-12 MED ORDER — SODIUM CHLORIDE 0.9 % IV SOLN
0.9 % | INTRAVENOUS | Status: DC | PRN
Start: 2022-02-12 — End: 2022-02-12

## 2022-02-12 MED ORDER — HYDRALAZINE HCL 20 MG/ML IJ SOLN
20 MG/ML | INTRAMUSCULAR | Status: DC | PRN
Start: 2022-02-12 — End: 2022-02-12

## 2022-02-12 MED ORDER — ONDANSETRON HCL 4 MG/2ML IJ SOLN
4 MG/2ML | INTRAMUSCULAR | Status: DC | PRN
Start: 2022-02-12 — End: 2022-02-12
  Administered 2022-02-12: 16:00:00 4 via INTRAVENOUS

## 2022-02-12 MED ORDER — LIDOCAINE HCL (PF) 1 % IJ SOLN
1 % | Freq: Once | INTRAMUSCULAR | Status: DC | PRN
Start: 2022-02-12 — End: 2022-02-12

## 2022-02-12 MED ORDER — MORPHINE SULFATE (PF) 2 MG/ML IV SOLN
2 MG/ML | INTRAVENOUS | Status: DC | PRN
Start: 2022-02-12 — End: 2022-02-13

## 2022-02-12 MED ORDER — ONDANSETRON HCL 4 MG/2ML IJ SOLN
4 MG/2ML | Freq: Once | INTRAMUSCULAR | Status: AC | PRN
Start: 2022-02-12 — End: 2022-02-12
  Administered 2022-02-12: 17:00:00 4 mg via INTRAVENOUS

## 2022-02-12 MED ORDER — FENTANYL CITRATE (PF) 100 MCG/2ML IJ SOLN
100 MCG/2ML | INTRAMUSCULAR | Status: DC | PRN
Start: 2022-02-12 — End: 2022-02-12

## 2022-02-12 MED ORDER — ONDANSETRON HCL 4 MG/2ML IJ SOLN
4 MG/2ML | Freq: Once | INTRAMUSCULAR | Status: DC | PRN
Start: 2022-02-12 — End: 2022-02-12

## 2022-02-12 MED ORDER — ENOXAPARIN SODIUM 30 MG/0.3ML IJ SOSY
30 MG/0.3ML | Freq: Two times a day (BID) | INTRAMUSCULAR | Status: DC
Start: 2022-02-12 — End: 2022-02-13

## 2022-02-12 MED ORDER — MIDAZOLAM HCL 2 MG/2ML IJ SOLN
2 MG/ML | INTRAMUSCULAR | Status: AC
Start: 2022-02-12 — End: ?

## 2022-02-12 MED ORDER — MORPHINE SULFATE 4 MG/ML IJ SOLN
4 MG/ML | INTRAMUSCULAR | Status: DC | PRN
Start: 2022-02-12 — End: 2022-02-13

## 2022-02-12 MED ORDER — LIDOCAINE HCL (PF) 1 % IJ SOLN
1 % | Freq: Once | INTRAMUSCULAR | Status: AC
Start: 2022-02-12 — End: 2022-02-12
  Administered 2022-02-12: 15:00:00 1 mL via INTRADERMAL

## 2022-02-12 MED ORDER — NORMAL SALINE FLUSH 0.9 % IV SOLN
0.9 % | Freq: Two times a day (BID) | INTRAVENOUS | Status: DC
Start: 2022-02-12 — End: 2022-02-13
  Administered 2022-02-13 (×2): 10 mL via INTRAVENOUS

## 2022-02-12 MED ORDER — NORMAL SALINE FLUSH 0.9 % IV SOLN
0.9 % | Freq: Two times a day (BID) | INTRAVENOUS | Status: DC
Start: 2022-02-12 — End: 2022-02-12

## 2022-02-12 MED ORDER — DEXMEDETOMIDINE HCL 200 MCG/2ML IV SOLN
200 MCG/2ML | INTRAVENOUS | Status: DC | PRN
Start: 2022-02-12 — End: 2022-02-12
  Administered 2022-02-12: 17:00:00 8 via INTRAVENOUS

## 2022-02-12 MED ORDER — PROPOFOL 200 MG/20ML IV EMUL
200 MG/20ML | INTRAVENOUS | Status: DC | PRN
Start: 2022-02-12 — End: 2022-02-12
  Administered 2022-02-12: 15:00:00 200 via INTRAVENOUS

## 2022-02-12 MED ORDER — TRAMADOL HCL 50 MG PO TABS
50 MG | Freq: Four times a day (QID) | ORAL | Status: DC | PRN
Start: 2022-02-12 — End: 2022-02-13

## 2022-02-12 MED ORDER — LACTATED RINGERS IV SOLN
INTRAVENOUS | Status: DC
Start: 2022-02-12 — End: 2022-02-12

## 2022-02-12 MED ORDER — SODIUM CHLORIDE 0.9 % IV SOLN
0.9 % | INTRAVENOUS | Status: DC | PRN
Start: 2022-02-12 — End: 2022-02-13

## 2022-02-12 MED ORDER — MIDAZOLAM HCL 2 MG/2ML IJ SOLN
2 MG/ML | INTRAMUSCULAR | Status: DC | PRN
Start: 2022-02-12 — End: 2022-02-12
  Administered 2022-02-12: 15:00:00 2 via INTRAVENOUS

## 2022-02-12 MED ORDER — LIDOCAINE HCL 1 % IJ SOLN
1 % | INTRAMUSCULAR | Status: DC | PRN
Start: 2022-02-12 — End: 2022-02-12
  Administered 2022-02-12: 15:00:00 50 via INTRAVENOUS

## 2022-02-12 MED ORDER — LACTATED RINGERS IV SOLN
INTRAVENOUS | Status: DC
Start: 2022-02-12 — End: 2022-02-12
  Administered 2022-02-12: 15:00:00 via INTRAVENOUS

## 2022-02-12 MED ORDER — ONDANSETRON HCL 4 MG/2ML IJ SOLN
4 MG/2ML | Freq: Four times a day (QID) | INTRAMUSCULAR | Status: DC | PRN
Start: 2022-02-12 — End: 2022-02-13
  Administered 2022-02-12 – 2022-02-13 (×4): 4 mg via INTRAVENOUS

## 2022-02-12 MED ORDER — DEXAMETHASONE SODIUM PHOSPHATE 20 MG/5ML IJ SOLN
20 MG/5ML | INTRAMUSCULAR | Status: DC | PRN
Start: 2022-02-12 — End: 2022-02-12
  Administered 2022-02-12: 15:00:00 8 via INTRAVENOUS

## 2022-02-12 MED ORDER — BUPIVACAINE-EPINEPHRINE 0.5% -1:200000 IJ SOLN (MIXTURES ONLY)
INTRAMUSCULAR | Status: DC | PRN
Start: 2022-02-12 — End: 2022-02-12
  Administered 2022-02-12: 16:00:00 15 via SUBCUTANEOUS
  Administered 2022-02-12: 17:00:00 10 via SUBCUTANEOUS

## 2022-02-12 MED ORDER — CLINDAMYCIN PHOSPHATE IN D5W 600 MG/50ML IV SOLN
600 MG/50ML | INTRAVENOUS | Status: AC
Start: 2022-02-12 — End: 2022-02-12
  Administered 2022-02-12: 15:00:00 600 mg via INTRAVENOUS

## 2022-02-12 MED ORDER — CLINDAMYCIN PHOSPHATE IN D5W 600 MG/50ML IV SOLN
600 MG/50ML | Freq: Three times a day (TID) | INTRAVENOUS | Status: DC
Start: 2022-02-12 — End: 2022-02-13
  Administered 2022-02-13 (×2): 600 mg via INTRAVENOUS

## 2022-02-12 MED ORDER — ONDANSETRON 4 MG PO TBDP
4 MG | Freq: Three times a day (TID) | ORAL | Status: DC | PRN
Start: 2022-02-12 — End: 2022-02-13

## 2022-02-12 MED ORDER — KCL IN DEXTROSE-NACL 20-5-0.45 MEQ/L-%-% IV SOLN
INTRAVENOUS | Status: DC
Start: 2022-02-12 — End: 2022-02-13
  Administered 2022-02-12: 18:00:00 via INTRAVENOUS

## 2022-02-12 MED FILL — LACTATED RINGERS IV SOLN: INTRAVENOUS | Qty: 1000

## 2022-02-12 MED FILL — CLINDAMYCIN PHOSPHATE IN D5W 600 MG/50ML IV SOLN: 600 MG/50ML | INTRAVENOUS | Qty: 50

## 2022-02-12 MED FILL — LIDOCAINE HCL 1 % IJ SOLN: 1 % | INTRAMUSCULAR | Qty: 20

## 2022-02-12 MED FILL — MIDAZOLAM HCL 2 MG/2ML IJ SOLN: 2 MG/ML | INTRAMUSCULAR | Qty: 2

## 2022-02-12 MED FILL — SENSORCAINE-MPF/EPINEPHRINE 0.5% -1:200000 IJ SOLN: INTRAMUSCULAR | Qty: 30

## 2022-02-12 MED FILL — DEXAMETHASONE SODIUM PHOSPHATE 20 MG/5ML IJ SOLN: 20 MG/5ML | INTRAMUSCULAR | Qty: 5

## 2022-02-12 MED FILL — PROPOFOL 200 MG/20ML IV EMUL: 200 MG/20ML | INTRAVENOUS | Qty: 20

## 2022-02-12 MED FILL — ONDANSETRON HCL 4 MG/2ML IJ SOLN: 4 MG/2ML | INTRAMUSCULAR | Qty: 2

## 2022-02-12 MED FILL — HYDROMORPHONE HCL 1 MG/ML IJ SOLN: 1 MG/ML | INTRAMUSCULAR | Qty: 1

## 2022-02-12 MED FILL — CLEOCIN IN D5W 600 MG/50ML IV SOLN: 600 MG/50ML | INTRAVENOUS | Qty: 50

## 2022-02-12 MED FILL — FENTANYL CITRATE (PF) 100 MCG/2ML IJ SOLN: 100 MCG/2ML | INTRAMUSCULAR | Qty: 2

## 2022-02-12 NOTE — Anesthesia Post-Procedure Evaluation (Signed)
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    Aldrete Phase I: Aldrete Score: 9    Aldrete Phase II:      Anesthesia Post Evaluation    Patient location during evaluation: PACU  Patient participation: complete - patient participated  Level of consciousness: awake  Airway patency: patent  Nausea & Vomiting: no nausea and no vomiting  Cardiovascular status: hemodynamically stable  Respiratory status: acceptable  Hydration status: stable  Multimodal analgesia pain management approach  Pain management: satisfactory to patient        No notable events documented.

## 2022-02-12 NOTE — Progress Notes (Signed)
Driver Bryk,Wade (Spouse)   [phone]  will drive patient home once patient is discharged

## 2022-02-12 NOTE — H&P (Signed)
Print  Patient  Name Lynn Carlson, Lynn Carlson ([id number], F) ID# [id number] Appt. Date/Time 02/03/2022 04:40PM  DOB 08-Sep-1970 Service Dept. [id number]  Provider Criss Alvine, MD  Insurance   [place]  Insurance # : [id number]  Policy/Group # : [id number]  Prescription: [facility] - Member is eligible. details  Patient's Care Team  Other: Meredith Staggers MD: [address], [place] [address], Ph [phone], Fax [phone] NPI: [id number]  Medical Oncologist: Dawayne Patricia: [address], [place] [address], Ph [phone], Fax [phone] NPI: [id number]  OB/GYN: Lorella Nimrod MD: [address], [place] [address], Ph [phone], Fax [phone] NPI: [id number]  Primary Care Provider: Dorena Dew DO: [facility] [address], [place] [address], Ph [phone], Fax [phone] NPI: [id number]  Patient's Pharmacies  [facility] [phone] [facility]): [address], [place] [address], Ph [phone], Fax [phone]  Vitals  Ht: 5 ft 9 in Stated 02/03/2022 04:59 pm  Wt: 228 lbs Stated 02/03/2022 04:59 pm  BMI: 33.7 02/03/2022 04:59 pm  BP: 137/84 sitting R arm 02/03/2022 05:02 pm  Pulse: 69 bpm regular 02/03/2022 05:02 pm  RR: 16 02/03/2022 05:02 pm  Pain Scale: 0 02/03/2022 04:59 pm  Allergies  Reviewed Allergies  ECOTRIN   IODINATED CONTRAST MEDIA   PENICILLINS   Medications  Reviewed Medications  albuterol sulfate HFA 90 mcg/actuation aerosol inhaler  05/08/20   filled surescripts  HYDROcodone 5 mg-acetaminophen 325 mg tablet  TAKE 1 TABLET BY MOUTH EVERY 6 HOURS AS NEEDED  09/17/21   filled surescripts  medroxyPROGESTERone 10 mg tablet  TAKE 1 TABLET BY MOUTH TWICE DAILY  01/10/20   filled surescripts  nitrofurantoin monohydrate/macrocrystals 100 mg capsule  TAKE 1 CAPSULE BY MOUTH TWICE DAILY FOR 7 DAYS  09/05/20   filled surescripts  ofloxacin  0.3 % ear drops  08/02/19   filled surescripts  omeprazole 20 mg capsule,delayed release  TAKE 1 CAPSULE BY MOUTH DAILY  06/16/19   filled surescripts  ondansetron 4 mg disintegrating tablet  DISSOLVE 1 TABLET ON THE TONGUE EVERY 8 HOURS AS NEEDED FOR NAUSEA AND VOMITING  09/05/20   filled surescripts  phentermine 37.5 mg tablet  10/23/19   filled surescripts  sulfamethoxazole 800 mg-trimethoprim 160 mg tablet  TAKE 1 TABLET BY MOUTH EVERY 12 HOURS FOR 10 DAYS  12/10/21   filled surescripts  topiramate 25 mg tablet  TAKE 1 OR 2 TABLETS BY MOUTH AT 5PM  10/24/19   filled surescripts  venlafaxine 37.5 mg tablet  TAKE 1 TABLET BY MOUTH EVERY DAY  10/24/19   filled surescripts  Vaccines  Vaccines not reviewed (last reviewed 08/26/2020)  Vaccine Type Date Amt. Route Site [place] Lot # Mfr. Exp.  Date VIS VIS  Given Vaccinator  Influenza  influenza, injectable, quadrivalent 01/26/19            Problems  Reviewed Problems  Anemia - Onset: 02/07/2018  Abnormal findings on diagnostic imaging of lung - Onset: 08/09/2017  Mammography abnormal - Onset: 08/29/2021  Abnormal findings on diagnostic imaging of breast - Onset: 02/01/2017  Infiltrating duct carcinoma of breast - Onset: 03/09/2016, Left - dx'ed 03/09/16    04/16/16 Left partial mastectomy w/SLNB, pT1cN,0)/2), ER/PR+, Her-2-Neu -. Oncotype 5  Family History  Reviewed Family History  Father - Malignant tumor of lung  Mother - End-stage renal  disease  Social History  Reviewed Social History  Substance Use  Do you or have you ever smoked tobacco?: Never smoker  How many years have you smoked tobacco?: 0  How much tobacco do you smoke?: None  Do you or have you ever used e-cigarettes or vape?: Never used electronic cigarettes  Do you or have you ever used smokeless tobacco?: Never used smokeless tobacco  What was the date of your most recent tobacco screening?: 02/03/2022  Has tobacco cessation counseling been provided?: Yes  On what date was tobacco cessation counseling  provided?: 02/03/2022  What is your level of alcohol consumption?: None  Which illicit or recreational drugs have you used?: no  Advance Directive  Do you have an advance directive?: No  Do you have a medical power of attorney?: No  Activities of Daily Living  Are you able to care for yourself?: Yes  Are you blind or do you have difficulty seeing?: No  Are you deaf or do you have serious difficulty hearing? : No  Do you have difficulty concentrating, remembering or making decisions?: No  Do you have difficulty walking or climbing stairs?: No  Do you have difficulty dressing or bathing?: No  Do you have difficulty doing errands alone?: No  Are you able to walk?: Yes: walks without restrictions  Do you have transportation difficulties?: No  Public Health and Travel  Have you recently traveled abroad?: No  Have you been to an area known to be high risk for COVID-19?: No  In the 14 days before symptom onset, have you had close contact with a laboratory-confirmed COVID-19 while that case was ill?: No  In the 14 days before symptom onset, have you had close contact with a person who is under investigation for COVID-19 while that person was ill?: No  Advance Directive on File With Office: No  Education?: Associates Degree  Occupation: self employeed  Do you have any specific communication needs due to blindness, deafness or language?: No  Other  If patient spent time in [place], [place] - Does the patient live in [place]?: No  In the 14 days before symptom onset, did the patient spend time in [place], [place]?: No  Live alone or with others?: with others  Marital status: Married  Urinary incontinence assessment performed?: No  Gender Identity and LGBTQ Identity  Gender identity: Identifies as Female  Assigned sex at birth: Female  Pronouns: she/her  First name used: Lynn Carlson  Sexual orientation: Straight or heterosexual  Surgical History  Reviewed Surgical History  Biopsy of breast - 09/16/2021 - [place] [facility] Left breast 4:00  4cmfn, Ribbon clip - [facility]  Dilation and curettage - 03/28/2019  Ligation of bilateral fallopian tubes - 03/28/2019  Hysteroscopy - 03/28/2019  Screening for malignant neoplasm of cervix - 02/28/2018  Lumpectomy, with needle localization (surg) - 04/16/2016 - Left breast with seninel node biopsy-Dr. Vashti Hey  Breast Surgery - 03/09/2016 - lt breast vac assisted bx - infiltrating ductal carcinoma - bowtie clip - Vashti Hey  GYN History  Reviewed GYN History  Age at Menarche: 83.  Number of Pregnancies: 2.  Age at First Child: 65.  Date of Last Mammogram: 09/10/2021 (Notes: Post clip 09-16-2021).  Did you breast feed?: N.  Date of Last Pap Smear: 02/16/2021.  Obstetric History  Obstetric History not reviewed (last reviewed 08/26/2020)  TOTAL FULL PRE AB. I AB. S ECTOPICS MULTIPLE LIVING  2 2        Past Medical History  Reviewed Past Medical History  Have you seen this provider in the last 3 years?: Y  Anemia: Y  Anti-hormone Therapy: Y  Asthma: Y  Breast Exam (date): Y - 05/03/17  Cancer - Breast: Y - left breast 03/09/16, left breast 09/16/21 invasive ductal carcinoma, ER(+) PR(+)  Radiation Treatment: Y - 06/2016  Chemotherapy Treatment: N - no chemo for breast cancer  Screening  Name Score Notes  PHQ-2/PHQ-9 0 (for the PHQ-2), Finding: Negative   HPI  Mrs. Rayborn is a 51 year-old female referred for abnormal mammogram and ultrasound. Patient with bilateral mammogram on 01/01/16 which revealed concerning density and pleomorphic calcifications in the upper outer quadrant of the left breast, BI-RADS 0. Patient then had an ultrasound performed on 02/03/16 which revealed a solid density in the upper outer quadrant 2 o'clock position 6 cm from the nipple, BI-RADS 4. Patient underwent ultrasound-guided biopsy. Pathology revealed infiltrating duct carcinoma, grade 1, ER/PR positive HER-2/neu negative. Lesion measured 1 cm on ultrasound and 4 millimeters on core biopsy. Patient with partial mastectomy and sentinel node biopsy on 04/16/16.  Pathology revealed a 1.7 cm tumor with clear margins. 2 lymph nodes were negative from metastatic disease. This is a T1 cN0 M0 lesion, ER/PR positive, HER-2 negative. Patient with Oncotype score of 5.  Patient completed radiation therapy on 08/06/16.  Patient with bilateral mammogram on 09/10/2021 which revealed a spiculated mass in the left breast 4 o'clock position measuring 5 mm in maximal diameter. There is an adjacent 2 mm satellite lesion, BI-RADS 5. Biopsy was performed on 09/16/2021 now returns as invasive ductal carcinoma, grade 2, ER 100% PR 100% HER2 negative. Patient wishes to hold off on surgery due to work. She did not wish to consider endocrine therapy. She returns today to schedule surgery and wishes to proceed with breast conserving therapy  ROS  Patient reports no fever, no significant weight change, no fatigue, good appetite, and no chills. She reports no headache, no dizziness, no hearing loss, and no ear pain. She reports no cough, no chest pain, and no wheezing. She reports Denies any neck lump or swelling.. She reports no change in size, no breast lumps, no change in appearance, no breast pain, no skin rash, no nipple discharge, no nipple inversion, and The patient performs monthly breast self exam (BSE).. She reports no nausea, no vomiting, no pain on swallowing, no difficulty swallowing, no abdominal pain, no diarrhea, no constipation, and no blood in stools. She reports The patient denies any skin itchiness.. She reports no joint pain, no joint stiffness, and no joint swelling. She reports no bruising and no anemia. She reports The patient denies unusual or excessive anxiety or depression., no depression, and no memory lapses or loss.  Physical Exam  Constitutional: General Appearance: healthy-appearing and well-developed. Level of Distress: NAD. Ambulation: ambulating normally.    Head: Head: normocephalic.    Eyes: Sclerae: non-icteric.    Lungs: Respiratory effort: no dyspnea. Auscultation:  no wheezing, rales/crackles, or rhonchi and breath sounds normal, good air movement, and CTA and without wheezes, rales, rhonchi.    Cardiovascular: Heart Auscultation: normal S1 and S2 and RRR without murmurs, rubs, or gallops and no murmurs.    Right Breast: Appearance: no dimpling, peau d'orange, or skin retraction. Masses No dominant or discrete breast mass. Nipple: no discharge or inversion. Upper extremity: no lymphedema. Axilla: no axillary lymphadenopathy.    Left Breast: Appearance: Incisional scar upper outer quadrant and axilla, mod retraction in the upper outer quadrant, tender  to palpation, no palpable mass, no lymphedema.    Psychiatric: Insight: good judgement. Mental Status: active and alert.  Assessment / Plan  Mrs. Placzek is a 51 year-old female referred for abnormal mammogram and ultrasound. Patient with bilateral mammogram on 01/01/16 which revealed concerning density and pleomorphic calcifications in the upper outer quadrant of the left breast, BI-RADS 0. Patient then had an ultrasound performed on 02/03/16 which revealed a solid density in the upper outer quadrant 2 o'clock position 6 cm from the nipple, BI-RADS 4. Patient underwent ultrasound-guided biopsy. Pathology revealed infiltrating duct carcinoma, grade 1, ER/PR positive HER-2/neu negative. Lesion measured 1 cm on ultrasound and 4 millimeters on core biopsy. Patient with partial mastectomy and sentinel node biopsy on 04/16/16. Pathology revealed a 1.7 cm tumor with clear margins. 2 lymph nodes were negative from metastatic disease. This is a T1 cN0 M0 lesion, ER/PR positive, HER-2 negative. Patient with Oncotype score of 5.        Patient completed radiation therapy on 08/06/16.        Patient with bilateral mammogram on 09/10/2021 which revealed a spiculated mass in the left breast 4 o'clock position measuring 5 mm in maximal diameter. There is an adjacent 2 mm satellite lesion, BI-RADS 5. Biopsy was performed on 09/16/2021 now returns as  invasive ductal carcinoma, grade 2, ER 100% PR 100% HER2 negative. Patient wishes to hold off on surgery due to work. She did not wish to consider endocrine therapy. She returns today to schedule surgery and wishes to proceed with breast conserving therapy. She will be scheduled for a left breast partial mastectomy with localization and sentinel node biopsy possible axillary dissection. Risks of bleeding, infection, positive margins positive lymph nodes were all reviewed, she agreed to proceed.    1. Infiltrating duct carcinoma of breast - Left  C50.412: Malignant neoplasm of upper-outer quadrant of left female breast

## 2022-02-12 NOTE — Op Note (Signed)
[place]    Surgery    Patient: Lynn Carlson Age: 50 y.o. Sex: female    Date of Birth: 20-May-1970 Admit Date: 02/12/2022    MRN: [id number]  CSN: [id number]       Date of Procedure: 02/12/2022     Surgeon: Criss Alvine, MD    Anesthesia: General     Primary Care Physician:  Dorena Dew, DO    Preoperative Diagnosis::  left breast carcinoma.     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  left breast partial mastectomy with Molli localization.  2.  Sentinel node biopsy.  3. Axillary node dissection   Complications:  None.     Condition  Stable.    Operation performed with curative intent: Yes  Tracer(s) used to identify sentinel nodes in the upfront surgery (nonneoadjuvant) setting (select all that apply) : Radioactive tracer  Tracer(s) used to identify sentinel nodes in the neoadjuvant setting (select all that apply): Not Applicable  All nodes (colored or noncolored) present at the end of a dye-filled lymphatic channel were removed: Not Applicable  All significantly radioactive nodes were removed: Yes  All palpably suspicious nodes were removed: Yes  Biopsy-proven positive nodes marked with clips prior to chemotherapy were identified and removed: Not Applicable       Procedure Details:  The patient taken the operating room, placed in supine position upon the operative table.  After induction of general anesthetic, the patient was prepped in the usual sterile fashion.  Timeout was performed.  Evaluation of the breast and axilla with the Neoprobe revealed point uptake in the axilla.  The axilla was anesthetized with 0.5% Marcaine.  Incision was made with a #15 blade.  Subcutaneous tissues divided using electrocautery taken down to the axillary fascia, which was carefully opened.  The patient was found to have 3 lymph nodes with dye uptake.  Lymph node uptake was recorded. The lymph nodes were dissected free using Metzenbaum scissors,  crile clamps and tied using 3-0 Vicryl ties.  These were sent for frozen section analysis.  The background uptake was 9.  Attention was turned to the breast.  The Molli probe was used to identify the reflector placement.  A periareolar incision was made using a #15 blade.  Subcutaneous tissue was divided using Metzenbaum scissors.  This was carried down through the breast tissue and subcutaneous tissue down to the Molli reflector using the probe.  This area was then grasped with Allis clamps and dissected free from the surrounding structures using Metzenbaum scissors dissection.  The specimen was oriented with sutures.  X-rays revealed the concerning area to be incorporated within the specimen.  The patient then underwent reexcision of the margins.  Each margin was carefully reexcised and marked using an ink to identify the new fresh margin.  These were sent as separate specimens.  The wound was irrigated with normal saline.  Hemostasis was achieved using electrocautery.  The patient underwent demarcation of the boundaries of the lumpectomy using small clips.  The subcutaneous tissues were approximated using 3-0 Vicryl and skin was closed using 4-0 Vicryl subcuticular closure.      At this time, the pathologist reported that the lymph node #1 was positive for metastasis.  Decision was made to proceed with axillary node dissection.  The lateral border pectoralis was followed up to the axillary vein.  The axillary vein was followed out laterally the long thoracic and thoracodorsal nerves were  identified and protected The course of the resection.  The dissection was begun along the inferior border of the axillary vein and taken inferiorly removing the packet of lymph nodes.  The dissection was carried out using Crile and right angle ties and 2-0 and 3-0 Vicryl ties.  The packet was sent for pathological evaluation.  Wound was irrigated normal saline.  No active bleeding was seen.  A JP drain was placed through a  separate stab wound incision and sutured using 4-0 nylon.  The axillary wound was irrigated with normal saline.  Subcutaneous tissues approximated using 3-0 Vicryl and skin was closed using 4-0 Vicryl subcuticular closure.  Both wounds were covered with Dermabond.  Sponge, needle and instrument count was correct.  She was taken to the recovery in stable condition.              Specimens:   ID Type Source Tests Collected by Time Destination   A : Left breast sentinel node #1 Tissue Lymph Node SURGICAL PATHOLOGY Alvy Bimler, MD 02/12/2022 [phone]    B : Left breast sentinel node #2 Tissue Lymph Node SURGICAL PATHOLOGY Alvy Bimler, MD 02/12/2022 [phone]    C : Left breast partial mastectomy Tissue Breast SURGICAL PATHOLOGY Alvy Bimler, MD 02/12/2022 [phone]    D : Left breast margin superior Tissue Breast SURGICAL PATHOLOGY Alvy Bimler, MD 02/12/2022 [phone]    E : Left breast margin medial Tissue Breast SURGICAL PATHOLOGY Alvy Bimler, MD 02/12/2022 [phone]    F : Left breast margin anterior Tissue Breast SURGICAL PATHOLOGY Alvy Bimler, MD 02/12/2022 [phone]    G : Left breast margin inferior Tissue Breast SURGICAL PATHOLOGY Alvy Bimler, MD 02/12/2022 0930    H : Left breast margin lateral Tissue Breast SURGICAL PATHOLOGY Alvy Bimler, MD 02/12/2022 0930    I : Left breast margin posterior Tissue Breast SURGICAL PATHOLOGY Alvy Bimler, MD 02/12/2022 0930    J : Left breast sentinel node #3 Tissue Lymph Node SURGICAL PATHOLOGY Alvy Bimler, MD 02/12/2022 1031    K : Left axillary contents Tissue Lymph Node SURGICAL PATHOLOGY Alvy Bimler, MD 02/12/2022 1109        Implants:  Implant Name Type Inv. Item Serial No. Manufacturer Lot No. LRB No. Used Action   HEMOCLIP TITANIUM SM (RED) - [id number] Clip HEMOCLIP TITANIUM SM (RED)  [employer] [id number] Left 1 Implanted         Drains:   Closed/Suction Drain Left Breast Bulb (Active)   Dressing Status  Intact 02/12/22 1223   Drain Status To bulb suction 02/12/22 1223

## 2022-02-13 MED ORDER — SCOPOLAMINE 1 MG/3DAYS TD PT72
1 MG/3DAYS | TRANSDERMAL | Status: DC
Start: 2022-02-13 — End: 2022-02-13
  Administered 2022-02-13: 17:00:00 1 via TRANSDERMAL

## 2022-02-13 MED FILL — SODIUM CHLORIDE FLUSH 0.9 % IV SOLN: 0.9 % | INTRAVENOUS | Qty: 10

## 2022-02-13 MED FILL — SCOPOLAMINE 1 MG/3DAYS TD PT72: 1 MG/3DAYS | TRANSDERMAL | Qty: 1

## 2022-02-13 MED FILL — ONDANSETRON HCL 4 MG/2ML IJ SOLN: 4 MG/2ML | INTRAMUSCULAR | Qty: 2

## 2022-02-13 MED FILL — CLINDAMYCIN PHOSPHATE IN D5W 600 MG/50ML IV SOLN: 600 MG/50ML | INTRAVENOUS | Qty: 50

## 2022-02-13 NOTE — Progress Notes (Signed)
Discharge instructions provided and reviewed with patient and her husband for discharge later today.  Supplies and logs for drain provided and patient and her husband said they are comfortable with drain care.  Hep lock needs to be removed prior to discharge.

## 2022-02-13 NOTE — Discharge Instructions (Addendum)
Empty drains twice a day and record.   On Tuesday begin calling office with output results.

## 2022-02-13 NOTE — Plan of Care (Signed)
Problem: Pain  Goal: Verbalizes/displays adequate comfort level or baseline comfort level  Outcome: Adequate for Discharge

## 2022-02-13 NOTE — Care Coordination-Inpatient (Signed)
Discharge Date: 02/13/2022    Discharge Location: Home    Discharge Needs: None    Transportation: Family will transport home     Communication with: Patient, RN, MD

## 2022-03-10 NOTE — Other (Signed)
PREOPERATIVE INSTRUCTIONS  Please Read Carefully    '[x]'$  Your procedure is scheduled on: 03/12/22  '[]'$  The day before your surgery, call the surgeon's office to check on the surgery time and the time you should arrive.     '[x]'$  On the day of your surgery, arrive at the time given to you by your surgeon and check in at the first floor registration desk.    '[x]'$  DO NOT eat anything after midnight before your surgery. This includes gum, mints or hard candy.  May have Water, black coffee (NO CREAM) or regular Gatorade (not sugar free) up to 3 hours prior to surgery, but no more than 6 ounces per hour (approx.  cup).    '[x]'$  You may brush your teeth the morning of surgery, however DO NOT swallow any water.    '[]'$  No smoking after midnight.  Smoking should be reduced a few days prior to surgery.    '[x]'$  If you are having an outpatient procedure, you must have a responsible adult bring you to the hospital.  They must remain in the surgical waiting area for the duration of your procedure and provide you with transportation home.  You may not drive yourself home.  We also recommend that you arrange for a responsible person to stay with you for 24 hours following your procedure.  Failure to comply with these instructions may result in cancellation of the procedure.    '[]'$  A parent or legal guardian must accompany minors to the hospital.  LEGAL GUARDIANS MUST bring custody papers with them the day of surgery.    '[]'$  If the lab gives you a blue blood ID band, do not throw it away.  Bring it with you on the day of surgery.      '[]'$  Please return to preop surgical testing no more than 14 days before surgery date to have your type and screen done prior to surgery.    '[]'$  If you have been diagnosed with Sleep Apnea and are using a CPAP, BiPAP, and/or Bi-Flex machine, please bring it with you the day of surgery.    '[]'$  Endoscopy patients, please follow the instructions provided by the doctors office.    '[]'$ If crutches are ordered,  you must get them and be instructed on proper use before the day of surgery.  Please bring them with you the day of surgery.      PREPARING THE SKIN BEFORE SURGERY Can Reduce the Risk of Infection  '[]'$  You have been given a Chlorhexidine skin preparation packet with instructions to bathe the night before surgery and the morning of surgery prior to arrival.    '[x]'$  use Dial (antibacterial) soap to bathe your skin the night before surgery and the morning of surgery.    '[x]'$  Do not shave your face, underarms, legs or any part of your body at least 48 hours before surgery.  Any clipping needed for surgery will be done at the hospital.      [place]  '[x]'$  Wear casual, loose fitting comfortable clothes the day of surgery.    '[x]'$  Remove ALL jewelry, including body piercings.  Leave these and all valuables at home.  Leave suitcases and/or overnight bag at home or in the car for a family member to bring when you get a room.    '[x]'$  DO NOT wear any makeup, nail polish, deodorant, body lotion, aftershave or contact lenses the day of surgery.  Please bring your glasses and  glasses case with you the day of surgery.    '[]'$  Bring any additional paperwork, such as doctors orders, consent form, POA (power of attorney), and/or advanced directives with you the day of surgery.    '[x]'$  Please notify your surgeon of any changes in your condition such as fever, sore throat or rash as soon as possible before your surgery date.  After office hours, call your surgeon's answering service.    '[x]'$ Bring picture identification and insurance cards with you the day of surgery.      MEDICATIONS:  '[x]'$  Stop taking aspirin and aspirin products/NSAIDs (non-steroidal anti-inflammatory drugs such as Motrin, Aleve, Advil, ibuprofen and BC Powder), vitamins and herbal pills 2 weeks before surgery OR as instructed by your doctor.  However, if you take a daily aspirin, please contact your primary care provider (PCP), for instructions prior  to surgery.    '[]'$  Contact your doctor regarding any blood thinner medications you take (such as Coumadin, Plavix, etc.) for instructions about what to do prior to surgery.    '[]'$ If you have diabetes, hold oral diabetic medications the night before surgery and the morning of surgery.  Hold insulin the morning of surgery unless told otherwise by your doctor.    '[x]'$  If you have asthma or use inhalers please bring them the day of surgery.    '[x]'$ Take the following medications (oral medications with a sip of water)  the day of surgery:   prilosec        If needed:  albuterol          Prior to Visit Medications    Medication Sig Taking? Authorizing Provider   ergocalciferol (ERGOCALCIFEROL) 1.25 MG (50000 UT) capsule Take 1 capsule by mouth once a week  Patient not taking: Reported on 02/12/2022  [provider]   albuterol sulfate HFA (PROVENTIL;VENTOLIN;PROAIR) 108 (90 Base) MCG/ACT inhaler Inhale 2 puffs into the lungs every 6 hours as needed for Wheezing  [provider]   omeprazole (PRILOSEC) 20 MG delayed release capsule Take 1 capsule by mouth daily  [provider]        *Visitor Policy-Surgical/Procedural Areas: Two people may escort the patient to the department's waiting room and may stay there during the procedure.  One visitor must be 88 years of age or older and the patient must arrive with their post procedure transportation home before procedure start time. The post-procedure transporting party must remain within the surgical waiting room for the duration of the procedure. Failure may result in the cancellation of the procedure*  Surgical Admitting Unit (SAU)/Phase II/Post Anesthesia Care Unit (PACU): Patients are allowed one visitor at a time. Visitors must be 70 or older. Note: If the patient's time in PACU is expected to be less than one hour, visitation may occur in Phase II Recovery or the patient's room. Visitation is generally limited to 5 - 10 minutes. Pediatric patients  are allowed two visitors to accommodate both parents if the patient is hemodynamically stable.    We want you to have a positive experience at [facility].  If any of these these instructions are not met, it is possible that your surgery will be canceled.  If you have any questions regarding your surgery, please call [facility] at [phone] or your surgeon's office for further assistance.           [facility]    [facility] Anesthesia     There are many ways to perform anesthesia for surgery.  The 2 techniques are generally classified as General Anesthesia and Regional Anesthesia.  While both techniques are very safe, they are distinctly different.  As with any anesthesia, there are risks, which may be increased if you already have heart disease, chronic lung conditions, or other serious medical problems.    General anesthesia puts you to sleep for your surgery.  It acts on your brain and nerves, and affects your entire body.  It may be administered by an injection, or through inhaling medication.  After you are asleep, a breathing tube may be placed in your windpipe to help you breathe during surgery.  General anesthesia may be more appropriate for longer or more involved surgery, especially if the position you will be in surgery is uncomfortable.  With general anesthesia you may experience a sore throat and hoarse voice for a few days, headache, nausea/vomiting, drowsiness, or blood pressure and breathing problems.    Regional anesthesia involves blocking the nerves to a specific area of the body with local anesthesia medication ("numbing medicine").  It is usually given in conjunction with varying degrees of twilight sedation.  When at all possible, the recommended type of anesthesia for both total knee replacement (TKA) and total hip replacement (THA) is a regional anesthesia technique.  This can be achieved utilizing a spinal block technique, or third placement of an epidural catheter.   In addition, your anesthesiologist may recommend that you have a peripheral nerve block placed to help with pain after the procedure.    -Spinal block-In a spinal block, local anesthetic (i.e. numbing medicine) is injected into the fluid that baths the spinal cord in the lower part of your back.  This produces a rapid numbing effect that wears off in a few hours.  After meeting the anesthesiologist and discussing her medical conditions and history, the anesthesiologist may decide to place a long-acting pain medicine as well.  There are some medical conditions and home medications that may preclude a spinal block.    -Epidural block-An epidural block uses a catheter inserted into your lower back to deliver numbing medicine.  The epidural block in the spinal block are administered in a very similar location and technique; however, the epidural catheter is placed in a slightly different area around the spine as compared to a spinal block.    -Peripheral nerve block (PNB)-For TKA, the anesthesiologist may discuss performing a PNB.  This technique places local anesthetic directly around the major nerves in your thigh, and one or multiple locations.  These blocks numb only the leg that is injected, and do not affect the other leg.  PNB's are used in addition to another anesthesia technique, and are for postoperative pain relief only.    The advantages of regional anesthesia for joint replacement surgery are considered to be significant.  There is a significant reduction in blood loss and blood transfusions, less nausea/vomiting, less drowsiness, improved pain control after surgery, better diabetes control, and less association with infection.  Additionally, there also are reduced risks (as compared to general anesthesia techniques) with serious medical complications such as heart attack, stroke, pneumonia, respiratory depression, or development of a blood clot in your legs that can go to your lungs.  Like with any  technique, there are risks.  With regional anesthesia, there is a low incidence of headache and nerve damage.  Most commonly though, if the regional technique proves challenging, it is a difficulty in getting the numbing medicine in close proximity to the nerves perform the  nerve block.  Extremely rarely (1 in 200,000), there can be a collection of blood from around the nerves.  Some patients may experience difficulty voiding (passing urine) for a period of time after a regional technique.

## 2022-03-12 ENCOUNTER — Inpatient Hospital Stay: Payer: BLUE CROSS/BLUE SHIELD | Attending: Specialist

## 2022-03-12 MED ORDER — ONDANSETRON HCL 4 MG/2ML IJ SOLN
4 MG/2ML | Freq: Once | INTRAMUSCULAR | Status: DC | PRN
Start: 2022-03-12 — End: 2022-03-12

## 2022-03-12 MED ORDER — PROPOFOL 200 MG/20ML IV EMUL
200 MG/20ML | INTRAVENOUS | Status: AC
Start: 2022-03-12 — End: ?

## 2022-03-12 MED ORDER — DEXAMETHASONE SODIUM PHOSPHATE 20 MG/5ML IJ SOLN
20 MG/5ML | INTRAMUSCULAR | Status: DC | PRN
Start: 2022-03-12 — End: 2022-03-12
  Administered 2022-03-12: 20:00:00 8 via INTRAVENOUS

## 2022-03-12 MED ORDER — BUPIVACAINE-EPINEPHRINE 0.5% -1:200000 IJ SOLN (MIXTURES ONLY)
INTRAMUSCULAR | Status: DC | PRN
Start: 2022-03-12 — End: 2022-03-12
  Administered 2022-03-12: 20:00:00 15 via INTRADERMAL

## 2022-03-12 MED ORDER — FENTANYL CITRATE (PF) 100 MCG/2ML IJ SOLN
100 MCG/2ML | INTRAMUSCULAR | Status: DC | PRN
Start: 2022-03-12 — End: 2022-03-12
  Administered 2022-03-12: 20:00:00 25 via INTRAVENOUS
  Administered 2022-03-12: 21:00:00 50 via INTRAVENOUS
  Administered 2022-03-12: 20:00:00 25 via INTRAVENOUS

## 2022-03-12 MED ORDER — ONDANSETRON HCL 4 MG/2ML IJ SOLN
4 MG/2ML | INTRAMUSCULAR | Status: AC
Start: 2022-03-12 — End: ?

## 2022-03-12 MED ORDER — LIDOCAINE HCL (PF) 2 % IJ SOLN
2 % | INTRAMUSCULAR | Status: AC
Start: 2022-03-12 — End: ?

## 2022-03-12 MED ORDER — ONDANSETRON HCL 4 MG/2ML IJ SOLN
4 MG/2ML | INTRAMUSCULAR | Status: DC | PRN
Start: 2022-03-12 — End: 2022-03-12
  Administered 2022-03-12: 20:00:00 4 via INTRAVENOUS

## 2022-03-12 MED ORDER — LACTATED RINGERS IV SOLN
INTRAVENOUS | Status: DC
Start: 2022-03-12 — End: 2022-03-12

## 2022-03-12 MED ORDER — EPHEDRINE SULFATE (PRESSORS) 50 MG/ML IV SOLN
50 MG/ML | INTRAVENOUS | Status: DC | PRN
Start: 2022-03-12 — End: 2022-03-12
  Administered 2022-03-12: 20:00:00 7.5 via INTRAVENOUS

## 2022-03-12 MED ORDER — FENTANYL CITRATE (PF) 100 MCG/2ML IJ SOLN
100 MCG/2ML | INTRAMUSCULAR | Status: AC
Start: 2022-03-12 — End: ?

## 2022-03-12 MED ORDER — MIDAZOLAM HCL 5 MG/5ML IJ SOLN
5 MG/ML | INTRAMUSCULAR | Status: DC | PRN
Start: 2022-03-12 — End: 2022-03-12
  Administered 2022-03-12: 20:00:00 2 via INTRAVENOUS

## 2022-03-12 MED ORDER — MIDAZOLAM HCL 5 MG/5ML IJ SOLN
5 MG/ML | INTRAMUSCULAR | Status: AC
Start: 2022-03-12 — End: ?

## 2022-03-12 MED ORDER — HYDROMORPHONE HCL 1 MG/ML IJ SOLN
1 MG/ML | INTRAMUSCULAR | Status: DC | PRN
Start: 2022-03-12 — End: 2022-03-12

## 2022-03-12 MED ORDER — DEXAMETHASONE SODIUM PHOSPHATE 20 MG/5ML IJ SOLN
20 MG/5ML | INTRAMUSCULAR | Status: AC
Start: 2022-03-12 — End: ?

## 2022-03-12 MED ORDER — NORMAL SALINE FLUSH 0.9 % IV SOLN
0.9 % | INTRAVENOUS | Status: DC | PRN
Start: 2022-03-12 — End: 2022-03-12

## 2022-03-12 MED ORDER — CLINDAMYCIN PHOSPHATE IN D5W 600 MG/50ML IV SOLN
600 MG/50ML | INTRAVENOUS | Status: AC
Start: 2022-03-12 — End: 2022-03-12
  Administered 2022-03-12: 20:00:00 600 mg via INTRAVENOUS

## 2022-03-12 MED ORDER — LACTATED RINGERS IV SOLN
INTRAVENOUS | Status: DC | PRN
Start: 2022-03-12 — End: 2022-03-12
  Administered 2022-03-12: 20:00:00 via INTRAVENOUS

## 2022-03-12 MED ORDER — SCOPOLAMINE 1 MG/3DAYS TD PT72
1 MG/3DAYS | Freq: Once | TRANSDERMAL | Status: DC
Start: 2022-03-12 — End: 2022-03-12
  Administered 2022-03-12: 18:00:00 1 via TRANSDERMAL

## 2022-03-12 MED ORDER — HYDRALAZINE HCL 20 MG/ML IJ SOLN
20 MG/ML | INTRAMUSCULAR | Status: DC | PRN
Start: 2022-03-12 — End: 2022-03-12

## 2022-03-12 MED ORDER — FENTANYL CITRATE (PF) 100 MCG/2ML IJ SOLN
100 MCG/2ML | INTRAMUSCULAR | Status: DC | PRN
Start: 2022-03-12 — End: 2022-03-12
  Administered 2022-03-12 (×2): 25 ug via INTRAVENOUS

## 2022-03-12 MED ORDER — SODIUM CHLORIDE 0.9 % IV SOLN
0.9 % | INTRAVENOUS | Status: DC | PRN
Start: 2022-03-12 — End: 2022-03-12

## 2022-03-12 MED ORDER — NORMAL SALINE FLUSH 0.9 % IV SOLN
0.9 % | Freq: Two times a day (BID) | INTRAVENOUS | Status: DC
Start: 2022-03-12 — End: 2022-03-12

## 2022-03-12 MED ORDER — BUPIVACAINE-EPINEPHRINE (PF) 0.5% -1:200000 IJ SOLN
INTRAMUSCULAR | Status: AC
Start: 2022-03-12 — End: ?

## 2022-03-12 MED ORDER — LIDOCAINE HCL (PF) 2 % IJ SOLN
2 % | INTRAMUSCULAR | Status: DC | PRN
Start: 2022-03-12 — End: 2022-03-12
  Administered 2022-03-12: 20:00:00 100 via INTRAVENOUS

## 2022-03-12 MED ORDER — PROPOFOL 200 MG/20ML IV EMUL
200 MG/20ML | INTRAVENOUS | Status: DC | PRN
Start: 2022-03-12 — End: 2022-03-12
  Administered 2022-03-12: 20:00:00 20 via INTRAVENOUS
  Administered 2022-03-12: 20:00:00 180 via INTRAVENOUS
  Administered 2022-03-12: 20:00:00 30 via INTRAVENOUS

## 2022-03-12 MED FILL — MIDAZOLAM HCL 5 MG/5ML IJ SOLN: 5 MG/ML | INTRAMUSCULAR | Qty: 5

## 2022-03-12 MED FILL — CLINDAMYCIN PHOSPHATE IN D5W 600 MG/50ML IV SOLN: 600 MG/50ML | INTRAVENOUS | Qty: 50

## 2022-03-12 MED FILL — XYLOCAINE-MPF 2 % IJ SOLN: 2 % | INTRAMUSCULAR | Qty: 5

## 2022-03-12 MED FILL — PROPOFOL 200 MG/20ML IV EMUL: 200 MG/20ML | INTRAVENOUS | Qty: 20

## 2022-03-12 MED FILL — FENTANYL CITRATE (PF) 100 MCG/2ML IJ SOLN: 100 MCG/2ML | INTRAMUSCULAR | Qty: 2

## 2022-03-12 MED FILL — SCOPOLAMINE 1 MG/3DAYS TD PT72: 1 MG/3DAYS | TRANSDERMAL | Qty: 1

## 2022-03-12 MED FILL — SENSORCAINE-MPF/EPINEPHRINE 0.5% -1:200000 IJ SOLN: INTRAMUSCULAR | Qty: 30

## 2022-03-12 MED FILL — DEXAMETHASONE SODIUM PHOSPHATE 20 MG/5ML IJ SOLN: 20 MG/5ML | INTRAMUSCULAR | Qty: 5

## 2022-03-12 MED FILL — ONDANSETRON HCL 4 MG/2ML IJ SOLN: 4 MG/2ML | INTRAMUSCULAR | Qty: 2

## 2022-03-12 MED FILL — LACTATED RINGERS IV SOLN: INTRAVENOUS | Qty: 1000

## 2022-03-12 NOTE — Discharge Instructions (Addendum)
Call for any redness, fever or chills.  May shower in 24 hours, no tub baths for 1 week.  Outpatient Surgery Discharge Instructions      General Anesthesia/ Sedation or Local Anesthesia    Do not drive or operate machinery for 24 hours.   You are advised to go directly home from the hospital. Also do not drive until cleared by the surgeon and until not taking narcotic pain medication.    Do not consume alcohol, tranquilizers, sleeping medications or any non-prescribed medication for 24 hours.     Do not make important decisions or sign any important papers in the next 24 Hours.    You should have someone with you tonight at home.    Children may appear flushed for several hours after surgery.    Activity       Resume as tolerated, no heavy lifting until cleared by Dr. Vashti Hey      Fluids and Diet    Begin with clear liquids, bouillon, dry toast, soda crackers. If not nauseated, you may go to a regular diet when you desire. Greasy and spicy foods are not advised.  Special diet instructions: none    Medications    Use all medications as directed.  When taking pain medications, you may  experience dizziness or drowsiness.    Do not drink alcohol or drive when taking pain medications.    You may take a non-prescription "headache" remedy type medication that you normally use, if your surgeon permits, and preferably one that does not    contain aspirin.    You may resume your normal daily prescription medication schedule      Your last dose of the following medications was taken at:    Narcotic pain pill medication:  _____________________    Tylenol/Acetaminophen: _________________________    Motrin/Ibuprofen/Toradol/Celebrex: ________________    Zofran/Ondansetron: ____________________________      Prescriptions     {[facility] OR [facility] DI Prescriptions:750101023}      Operative Site        Shower tomorrow      Dermabond  Your incision is covered with dermabond (aka skin glue). You may shower in 24 hrs if your doctor okays it,  but no tub baths/swimming. Do not scrub/rub the incision and pat the incisions dry.  Do not put any kind of ointment, cream, or lotion over the incision. Leave the skin glue on your skin until it falls off on its own, do not attempt to remove, pick at, or rub the adhesive.  If the doctor told you to use a bandage, put in a new bandage after cleaning the incisions or if the bandage gets wet or dirty.      It is important to wash your hands properly. This is the single best way to prevent the spread of infections. Hand-washing can help keep you from getting sick. It is easy, doesn't cost much, and it works.   Make sure that you and your caregivers follow safe hand-washing routines.         Return to Work     You may return to work when cleared by the Psychologist, sport and exercise. Contact the surgeon's office for a work note if needed.         Call your surgeon if you have problems that concern you.  After office hours, you can reach your physician through their answering service.  IF YOU NEED IMMEDIATE ATTENTION, PLEASE GO TO THE NEAREST EMERGENCY DEPARTMENT.  [place] number  is [phone]        SPECIFIC COMPLICATIONS TO WATCH FOR:     - Fever over 101 F, by mouth   - Numb, tingling, or cold fingers or toes   - Swelling around operative area   - Pain not relieved by pain medication ordered   - Increased redness, warmth, hardness, around operative area   - Blood soaked dressing (Small amount of drainage may be normal)   - Increasing and progressive drainage from surgical area or exam site   - Inability to urinate   - Uncontrolled Nausea/Vomiting   - Fever over 101 F, by mouth   - severe abdominal pain   - increasing/unresolved nausea/vomiting

## 2022-03-12 NOTE — Progress Notes (Signed)
[place] - Ashlynd Michna - husband - [phone]

## 2022-03-12 NOTE — Anesthesia Post-Procedure Evaluation (Signed)
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 [facility]) [C50.512])    Surgeons: Alvy Bimler, MD Responsible Provider: Truman Hayward, MD    Anesthesia Type: General ASA Status: 2          Anesthesia Type: General    Aldrete Phase I: Aldrete Score: 9    Aldrete Phase II: Aldrete Score: 10    Anesthesia Post Evaluation    Patient location during evaluation: PACU  Patient participation: complete - patient participated  Level of consciousness: awake  Pain score: 1  Airway patency: patent  Nausea & Vomiting: no nausea and no vomiting  Cardiovascular status: hemodynamically stable  Respiratory status: acceptable  Hydration status: euvolemic  Multimodal analgesia pain management approach        No notable events documented.

## 2022-03-12 NOTE — H&P (Signed)
Name Lynn Carlson (52DG, F)   Provider Criss Alvine, MD  Insurance   Med Primary: [employer]-[facility])  Insurance # : [id number]  Policy/Group # : [id number]  Prescription: [place] - Member is eligible. details  Patient's Care Team  Other: Meredith Staggers MD: [address], [place] [address], Ph [phone], Fax [phone] NPI: [id number]  Medical Oncologist: Dawayne Patricia: [address], [place] [address], Ph [phone], Fax [phone] NPI: [id number]  OB/GYN: Lorella Nimrod MD: [address], [place] [address], Ph [phone], Fax [phone] NPI: [id number]  Primary Care Provider: Dorena Dew DO: [facility] [address], [place] [address], Ph [phone], Fax [phone] NPI: [id number]  Patient's Pharmacies  [facility] [phone] [facility]): [facility], [place] [address], Ph [phone], Fax [phone]     Allergies  Reviewed Allergies  ECOTRIN   IODINATED CONTRAST MEDIA   PENICILLINS   Medications  Reviewed Medications  albuterol sulfate HFA 90 mcg/actuation aerosol inhaler  05/08/20   filled surescripts  medroxyPROGESTERone 10 mg tablet  TAKE 1 TABLET BY MOUTH TWICE DAILY  01/10/20   filled surescripts  omeprazole 20 mg capsule,delayed release  TAKE 1 CAPSULE BY MOUTH DAILY  06/16/19   filled surescripts  ondansetron HCL 4 mg tablet  Take 1 tablet(s) every 6 hours by oral route as needed, for nausea.  02/12/22   filled surescripts  phentermine 37.5 mg tablet  10/23/19   filled surescripts  scopolamine 1 mg over 3 days transdermal patch  APPLY 1 PATCH TOPICALLY TO THE SKIN EVERY 72 HOURS  02/17/22   filled surescripts  topiramate 25 mg tablet  TAKE 1 OR 2 TABLETS BY MOUTH AT 5PM  10/24/19   filled surescripts  venlafaxine 37.5 mg tablet  TAKE 1 TABLET BY MOUTH EVERY DAY  10/24/19   filled surescripts  Vaccines  Reviewed Vaccines  Vaccine  Type Date Amt. Route Site [place] # Mfr. Exp.  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 Oncotype 5  GYN History  Reviewed GYN History  Age at Menarche: 36.  Number of Pregnancies: 2.  Age at First Child: 18.  Date of Last Mammogram: 09/10/2021 (Notes: Post clip 09-16-2021).  Did you breast feed?: N.  Date of Last Pap Smear: 02/16/2021.  Obstetric History  Reviewed Obstetric History  TOTAL FULL PRE AB. I AB. S ECTOPICS MULTIPLE LIVING  2 2        Family History  Reviewed Family History  Father - Malignant tumor of lung  Mother - End-stage renal disease  Social History  Reviewed Social History  Substance Use  Do you or have you ever smoked tobacco?: Never smoker  How many years have you smoked tobacco?: 0  How much tobacco do you smoke?: None  Do you or have you ever used any other forms of tobacco or nicotine?: No  Do you or have you ever used e-cigarettes or vape?: Never used electronic cigarettes  Do you or have you ever used smokeless tobacco?: Never used smokeless tobacco  What was the date of your most recent tobacco screening?: 02/24/2022  Has tobacco cessation counseling been provided?: No  What is your level of alcohol consumption?: None  Do you use any illicit or recreational drugs?: No  Which illicit or recreational drugs have  you used?: no  Advance Directive  Do you have an advance directive?: No  Do you have a medical power of attorney?: No  Activities of Daily Living  Are you able to care for yourself?: Yes  Are you blind or do you have difficulty seeing?: No  Are you deaf or do you have serious difficulty hearing? : No  Do you have difficulty concentrating, remembering or making decisions?: No  Do you have difficulty walking or climbing stairs?: No  Do you have difficulty dressing or bathing?: No  Do you have difficulty doing errands alone?: No  Are you able to walk?: Yes: walks without restrictions  Do you have transportation difficulties?: No  Public Health and Travel  Have you recently traveled abroad?: No  Have you been to an area known to be high risk for COVID-19?: No  In the 14 days before symptom onset, have you had close contact with a laboratory-confirmed COVID-19 while that case was ill?: No  In the 14 days before symptom onset, have you had close contact with a person who is under investigation for COVID-19 while that person was ill?: No  Advance Directive on File With Office: No  Education?: Associates Degree  Occupation: self employeed  Do you have any specific communication needs due to blindness, deafness or language?: No  Other  If patient spent time in [place], [place] - Does the patient live in [place]?: No  In the 14 days before symptom onset, did the patient spend time in [place], [place]?: No  Live alone or with others?: with others  Marital status: Married  Urinary incontinence assessment performed?: No  Gender Identity and LGBTQ Identity  Gender identity: Identifies as Female  Assigned sex at birth: Female  Pronouns: she/her  First name used: Lynn Carlson  Sexual orientation: Straight or heterosexual  Surgical History  Reviewed Surgical History  Partial mastectomy - 02/12/2022 - Left partial mastectomy w/SLNB-ALND  Biopsy of breast - 09/16/2021 - [place] BX Left breast 4:00 4cmfn, Ribbon clip - [facility]  Dilation and  curettage - 03/28/2019  Ligation of bilateral fallopian tubes - 03/28/2019  Hysteroscopy - 03/28/2019  Screening for malignant neoplasm of cervix - 02/28/2018  Lumpectomy, with needle localization (surg) - 04/16/2016 - Left breast with seninel node biopsy-Dr. Vashti Hey  Breast Surgery - 03/09/2016 - lt breast vac assisted bx - infiltrating ductal carcinoma - bowtie clip - Vashti Hey  Past Medical History  Reviewed Past Medical History  Anemia: Y  Anti-hormone Therapy: Y  Asthma: Y  Breast Exam (date): Y - 05/03/17  Cancer - Breast: Y - left breast 03/09/16, left breast 09/16/21 invasive ductal carcinoma, ER(+) PR(+)  Radiation Treatment: Y - 06/2016  Chemotherapy Treatment: (no answer) - no chemo for breast cancer  Screening  Name Score Notes  PHQ-2/PHQ-9 0 (for the PHQ-2), Finding: Negative   HPI  Mrs.  Carlson is a 52 year-old female referred for abnormal mammogram and ultrasound. Patient with bilateral mammogram on 01/01/16 which revealed concerning density and pleomorphic calcifications in the upper outer quadrant of the left breast, BI-RADS 0. Patient then had an ultrasound performed on 02/03/16 which revealed a solid density in the upper outer quadrant 2 o'clock position 6 cm from the nipple, BI-RADS 4. Patient underwent ultrasound-guided biopsy. Pathology revealed infiltrating duct carcinoma, grade 1, ER/PR positive HER-2/neu negative. Lesion measured 1 cm on ultrasound and 4 millimeters on core biopsy. Patient with partial mastectomy and sentinel node biopsy on 04/16/16. Pathology revealed a 1.7 cm tumor with clear margins. 2 lymph nodes were negative from metastatic disease. This is a T1 cN0 M0 lesion, ER/PR positive, HER-2 negative. Patient with Oncotype score of 5.  Patient completed radiation therapy on 08/06/16.  Patient with bilateral mammogram on 09/10/2021 which revealed a spiculated mass in the left breast 4 o'clock position measuring 5 mm in maximal diameter. There is an adjacent 2 mm satellite lesion, BI-RADS 5.  Biopsy was performed on 09/16/2021 now returns as invasive ductal carcinoma, grade 2, ER 100% PR 100% HER2 negative. Patient wishes to hold off on surgery due to work. She did not wish to consider endocrine therapy. She had partial mastectomy and sentinel node biopsy on 02/12/2022. Final pathology revealed a 11 mm tumor with positive anterior margin and 1 of 12 lymph    ROS  Patient reports no fever, no significant weight change, no fatigue, good appetite, and no chills. She reports no headache, no dizziness, no hearing loss, and no ear pain. She reports no cough, no chest pain, and no wheezing. She reports Denies any neck lump or swelling.. She reports no change in size, no breast lumps, no change in appearance, no breast pain, no skin rash, no nipple discharge, no nipple inversion, and The patient performs monthly breast self exam (BSE).. She reports no nausea, no vomiting, no pain on swallowing, no difficulty swallowing, no abdominal pain, no diarrhea, no constipation, and no blood in stools. She reports The patient denies any skin itchiness.. She reports no joint pain, no joint stiffness, and no joint swelling. She reports no bruising and no anemia. She reports The patient denies unusual or excessive anxiety or depression., no depression, and no memory lapses or loss.  Physical Exam  Constitutional: General Appearance: healthy-appearing and well-developed. Level of Distress: NAD. Ambulation: ambulating normally.    Head: Head: normocephalic.    Eyes: Sclerae: non-icteric.    Lungs: Respiratory effort: no dyspnea. Auscultation: no wheezing, rales/crackles, or rhonchi and breath sounds normal, good air movement, and CTA and without wheezes, rales, rhonchi.    Cardiovascular: Heart Auscultation: normal S1 and S2 and RRR without murmurs, rubs, or gallops and no murmurs.    Right Breast: Appearance: no dimpling, peau d'orange, or skin retraction. Masses No dominant or discrete breast mass. Nipple: no discharge or  inversion. Upper extremity: no lymphedema. Axilla: no axillary lymphadenopathy.    Left Breast: Appearance: Left breast periareolar incision and left axillary incisions with minimal ecchymosis, no erythema. JP with minimal serous drainage.    Psychiatric: Insight: good judgement. Mental Status: active and alert.    Left breast periareolar incision and left axillary incisions with minimal ecchymosis, no erythema. JP with minimal serous drainage  Assessment / Plan  Lynn Carlson is a 52 year-old female referred for abnormal mammogram and ultrasound. Patient with bilateral mammogram on 01/01/16 which revealed concerning density and pleomorphic calcifications in the upper outer  quadrant of the left breast, BI-RADS 0. Patient then had an ultrasound performed on 02/03/16 which revealed a solid density in the upper outer quadrant 2 o'clock position 6 cm from the nipple, BI-RADS 4. Patient underwent ultrasound-guided biopsy. Pathology revealed infiltrating duct carcinoma, grade 1, ER/PR positive HER-2/neu negative. Lesion measured 1 cm on ultrasound and 4 millimeters on core biopsy. Patient with partial mastectomy and sentinel node biopsy on 04/16/16. Pathology revealed a 1.7 cm tumor with clear margins. 2 lymph nodes were negative from metastatic disease. This is a T1 cN0 M0 lesion, ER/PR positive, HER-2 negative. Patient with Oncotype score of 5.            Patient completed radiation therapy on 08/06/16.            Patient with bilateral mammogram on 09/10/2021 which revealed a spiculated mass in the left breast 4 o'clock position measuring 5 mm in maximal diameter. There is an adjacent 2 mm satellite lesion, BI-RADS 5. Biopsy was performed on 09/16/2021 now returns as invasive ductal carcinoma, grade 2, ER 100% PR 100% HER2 negative. Patient wishes to hold off on surgery due to work. She did not wish to consider endocrine therapy. She had partial mastectomy and sentinel node biopsy on 02/12/2022. Final pathology revealed a 11  mm tumor with positive anterior margin and 1 of 12 lymph. JP was removed. She tolerated this well. We discussed the results of the positive anterior margin and recommended reexcision of this anterior margin. Ultrasounds performed which revealed the cavity at the 4 to 5 o'clock position just below the areolar border. This area of skin will require removal. Risks of bleeding and infection were reviewed, she agreed to proceed

## 2022-03-12 NOTE — Op Note (Signed)
[facility]    Patient: Lynn Carlson Age: 52 y.o. Sex: female    Date of Birth: 05/20/1970 Admit Date: 03/12/2022 PCP: Dorena Dew, DO   MRN: [id number]  CSN: [id number]         Date of Procedure: 03/12/2022     Surgeon: Criss Alvine, MD     Primary Care Physician:  Dorena Dew, DO     Preoperative Diangosis:  A 52 y.o. female with Infiltrating ductal carcinoma of lower-outer quadrant of left breast in female [facility]) [C50.512] with positive anterior margin     Postoperative Diagnosis:  same         Procedure(s):  RE EXCISION OF LEFT ANTERIOR MARGIN OF LEFT BREAST     Anesthesia: General     Complication:  None    Condition: Stable to recovery    Indications: The patient was admitted to the hospital with left breast cancer and an positive anterior margin.   The patient now presents for surgery after discussing therapeutic alternatives.       Discussed the risks of surgery including infection, hematoma, bleeding.  Other risks include recurrence or persistence of symptoms, and the risks of general anesthesia including Myocardial Infarction, Cerebrovascular Accident, sudden death or even reaction to anesthetic medications. The patient understands the risks, any and all questions were answered to the patient's satisfaction and they freely signed the consent form for the procedure.     Procedure:  The patient was taken to the operating room and placed in supine position upon the operating table.  After induction of IV sedation, the patient underwent a general anesthetic.  She was prepped and draped in the usual fashion.  Timeout was performed.  Elliptical and skin incision was made at the periareolar border extending from the 2:00 to 6 o'clock position.  Subcutaneous tissues divided electrocautery this was taken down to the seroma cavity which was beneath the wound.  This was unroofed excising the anterior margin.  This was sent for pathological evaluation.  Hemostasis was  achieved using electrocautery.  Breast tissue approximated using 3-0 Vicryl and skin was closed using 4-0 Vicryl subcuticular closure and Dermabond.  Sponge, needle and instrument count was correct.       Daizha Anand Salamanca, MD  March 12, 2022  3:18 PM

## 2022-03-12 NOTE — Anesthesia Pre-Procedure Evaluation (Addendum)
Department of Anesthesiology  Preprocedure Note       Name:  Lynn Carlson   Age:  52 y.o.  DOB:  [birth year]                                          MRN:  [id number]         Date:  03/12/2022      Surgeon: Juliann Mule):  Alvy Bimler, MD    Procedure: Procedure(s):  RE EXCISION OF LEFT ANTERIOR MARGIN OF LEFT BREAST    Medications prior to admission:   Prior to Admission medications    Medication Sig Start Date End Date Taking? Authorizing Provider   ergocalciferol (ERGOCALCIFEROL) 1.25 MG (50000 UT) capsule Take 1 capsule by mouth once a week 01/09/20   [provider]   albuterol sulfate HFA (PROVENTIL;VENTOLIN;PROAIR) 108 (90 Base) MCG/ACT inhaler Inhale 2 puffs into the lungs every 6 hours as needed for Wheezing    [provider]   omeprazole (PRILOSEC) 20 MG delayed release capsule Take 1 capsule by mouth daily    [provider]       Current medications:    Current Facility-Administered Medications   Medication Dose Route Frequency Provider Last Rate Last Admin   . clindamycin (CLEOCIN) 600 mg in dextrose 5 % 50 mL IVPB  600 mg IntraVENous On Call to [place], [place], MD   Held at 03/12/22 1212       Allergies:    Allergies   Allergen Reactions   . Aspirin Other (See Comments)     Other reaction(s): PT REPORTS INTERNAL BLEEDING   . Iodinated Contrast Media Hives   . Onion Hives   . Onion Extract Hives   . Penicillins      Other reaction(s): Not Reported This Time       Problem List:    Patient Active Problem List   Diagnosis Code   . Primary cancer of left breast with stage 2 nodal metastasis per American Joint Committee on Cancer 7th edition (N2) ([place]) C50.912, C77.9       Past Medical History:        Diagnosis Date   . Acid reflux    . Anemia    . Asthma    . Breast CA ([place]) 2018   . COVID-19 2020   . GERD (gastroesophageal reflux disease)    . Infiltrating ductal carcinoma of left breast ([place])    . Iron deficiency    . PONV (postoperative nausea and vomiting)         Past Surgical History:        Procedure Laterality Date   . BREAST LUMPECTOMY Left 04/16/2016    NEEDLE LOCALIZE LUMPECTOMY LEFT BREAST; SENTINEL NODE BIOPSY; POSSIBLE AXILLARY DISSECTION performed by Marvene Staff, MD at [facility]   . MASTECTOMY Left 02/12/2022    PARTIAL MASTECTOMY LOCALIZED LEFT BREAST WITH SENTINEL LYMPH NODE BIOPSY; LEFT AXILLARY LYMPH NODE DISSECTION  performed by Alvy Bimler, MD at [place] [place]   . TUBAL LIGATION  2022       Social History:    Social History     Tobacco Use   . Smoking status: Never   . Smokeless tobacco: Never   Substance Use Topics   . Alcohol use: No  Counseling given: Not Answered      Vital Signs (Current):   Vitals:    03/10/22 0936 03/12/22 1122   BP:  131/77   Pulse:  62   Resp:  18   Temp:  97.8 F (36.6 C)   TempSrc:  Oral   SpO2:  100%   Weight: 111.6 kg (246 lb) 112.4 kg (247 lb 12.8 oz)   Height: 1.753 m ('5\' 9"'$ ) 1.753 m ('5\' 9"'$ )                                              BP Readings from Last 3 Encounters:   03/12/22 131/77   02/13/22 117/65       NPO Status: Time of last liquid consumption: 1900                        Time of last solid consumption: 1900                        Date of last liquid consumption: 03/11/22                        Date of last solid food consumption: 03/11/22    BMI:   Wt Readings from Last 3 Encounters:   03/12/22 112.4 kg (247 lb 12.8 oz)   02/12/22 111.6 kg (246 lb 0.5 oz)     Body mass index is 36.59 kg/m.    CBC:   Lab Results   Component Value Date/Time    WBC 6.8 02/07/2018 08:32 PM    RBC 3.30 02/07/2018 08:32 PM    HGB 9.7 02/07/2018 08:32 PM    HCT 30.0 02/07/2018 08:32 PM    MCV 90.9 02/07/2018 08:32 PM    PLT 433 02/07/2018 08:32 PM       CMP: No results found for: "NA", "K", "CL", "CO2", "BUN", "CREATININE", "GFRAA", "AGRATIO", "LABGLOM", "GLUCOSE", "GLU", "PROT", "CALCIUM", "BILITOT", "ALKPHOS", "AST", "ALT"    POC Tests: No results for input(s): "POCGLU", "POCNA", "POCK",  "POCCL", "POCBUN", "POCHEMO", "POCHCT" in the last 72 hours.    Coags: No results found for: "PROTIME", "INR", "APTT"    HCG (If Applicable): No results found for: "PREGTESTUR", "PREGSERUM", "HCG", "HCGQUANT"     ABGs: No results found for: "PHART", "PO2ART", "PCO2ART", "HCO3ART", "BEART", "O2SATART"     Type & Screen (If Applicable):  No results found for: "LABABO", "LABRH"    Drug/Infectious Status (If Applicable):  No results found for: "HIV", "HEPCAB"    COVID-19 Screening (If Applicable): No results found for: "COVID19"        Anesthesia Evaluation  Patient summary reviewed   history of anesthetic complications: PONV.  Airway: Mallampati: II  TM distance: >3 FB   Neck ROM: full  Mouth opening: > = 3 FB   Dental:          Pulmonary:normal exam  breath sounds clear to auscultation  (+) asthma:           Patient did not smoke on day of surgery.                 Cardiovascular:Negative CV ROS  Exercise tolerance: good (>4 METS),         ECG reviewed  Rhythm: regular  Rate: normal  Beta Blocker:  Not on Beta Blocker         Neuro/Psych:   Negative Neuro/Psych ROS              GI/Hepatic/Renal:   (+) GERD:, morbid obesity          Endo/Other: Negative Endo/Other ROS                    Abdominal:             Vascular: negative vascular ROS.         Other Findings:           Anesthesia Plan      general     ASA 2       Induction: intravenous.    MIPS: Postoperative opioids intended and Prophylactic antiemetics administered.  Anesthetic plan and risks discussed with patient.    Use of blood products discussed with patient whom consented to blood products.   Plan discussed with CRNA.              I have informed MADDYSON KEIL or his/her guardian of the nature and purpose of the type of anesthesia, the reasonable alternative anesthetic methods, pertinent foreseeable risks involved and the possibility of complications. I have explained that an alternative form of anesthesia may be required by unexpected  conditions arising before or during the procedure. Ruthine Dose or his/her guardian understands that general anesthesia may be required for his/her safety or comfort. Questions have been answered to the satisfaction of the patient or his/her guardian who accepts the risks and agrees to proceed as planned. The above anesthetic review of the patient's medical history, his/her exam, his/her tests, his/her assessment, subsequent anesthetic plan and consent have been accomplished pre-procedure.      Rosemarie Ax, MD   03/12/2022

## 2022-03-24 ENCOUNTER — Encounter

## 2022-03-27 ENCOUNTER — Inpatient Hospital Stay: Admit: 2022-03-27 | Payer: BLUE CROSS/BLUE SHIELD | Attending: Hematology & Oncology | Primary: Internal Medicine

## 2022-03-27 DIAGNOSIS — Z13820 Encounter for screening for osteoporosis: Secondary | ICD-10-CM

## 2022-03-27 DIAGNOSIS — Z780 Asymptomatic menopausal state: Secondary | ICD-10-CM

## 2022-07-21 ENCOUNTER — Encounter

## 2022-09-11 ENCOUNTER — Encounter

## 2022-09-14 ENCOUNTER — Ambulatory Visit: Primary: Internal Medicine

## 2022-10-24 ENCOUNTER — Inpatient Hospital Stay
Admit: 2022-10-24 | Discharge: 2022-10-25 | Disposition: A | Discharge status: Home / Self Care | Payer: BLUE CROSS/BLUE SHIELD | Attending: Emergency Medicine

## 2022-10-24 DIAGNOSIS — T7800XA Anaphylactic reaction due to unspecified food, initial encounter: Secondary | ICD-10-CM

## 2022-10-24 MED ORDER — EPINEPHRINE PF 1 MG/ML IJ SOLN
1 | Freq: Once | INTRAMUSCULAR | Status: AC
Start: 2022-10-24 — End: 2022-10-24

## 2022-10-24 NOTE — ED Provider Notes (Signed)
 [facility]  Emergency Department Treatment Report        Patient: Lynn Carlson Age: 52 y.o. Sex: female    Date of Birth: 1970/12/01 Admit Date: 10/24/2022 PCP: Towana Helling, DO   MRN: [id number]  CSN: [id number]     Room: OTF/OTF Time Dictated: 1:23 AM            Chief Complaint   Chief Complaint   Patient presents with    Allergic Reaction       History of Present Illness   This is a 52 y.o. female Patient presents via walk-in triage with allergic reaction.  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  She has rash, upper airway swelling sensation, does not have an EpiPen  with her.  Took 1/2 pill Benadryl  before coming in.  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)     Iron deficiency     PONV (postoperative nausea and vomiting)      Past Surgical History:   Procedure Laterality Date    BREAST LUMPECTOMY Left 04/16/2016    NEEDLE LOCALIZE LUMPECTOMY LEFT BREAST; SENTINEL NODE BIOPSY; POSSIBLE AXILLARY DISSECTION performed by Westley JINNY Jewels, MD at [facility] MAIN OR    BREAST SURGERY Left 03/12/2022    RE EXCISION OF LEFT ANTERIOR MARGIN OF LEFT BREAST performed by Jewels Westley Schanz, MD at [facility] MAIN OR    MASTECTOMY Left 02/12/2022    PARTIAL MASTECTOMY LOCALIZED LEFT BREAST WITH SENTINEL LYMPH NODE BIOPSY; LEFT AXILLARY LYMPH NODE DISSECTION  performed by Jewels Westley Schanz, MD at [facility] MAIN OR    TUBAL LIGATION  2022       Social History     Social History     Socioeconomic History    Marital status: Married   Tobacco Use    Smoking status: Never    Smokeless tobacco: Never   Vaping Use    Vaping status: Never Used   Substance and Sexual Activity    Alcohol use:  No    Drug use: Never       Family History     Family History   Problem Relation Age of Onset    Cancer Father        Current Medications     Prior to Admission medications    Medication Sig Start Date End Date Taking? Authorizing Provider   EPINEPHrine  (EPIPEN  2-PAK) 0.3 MG/0.3ML SOAJ injection Inject 0.3 mLs into the muscle once for 1 dose Use as directed for allergic reaction Hold on thigh for 10 seconds to complete injection 10/24/22 10/24/22 Yes Walden Lorrene CHRISTELLA, MD   ergocalciferol (ERGOCALCIFEROL) 1.25 MG (50000 UT) capsule Take 1 capsule by mouth once a week 01/09/20   [provider]   albuterol sulfate HFA (PROVENTIL;VENTOLIN;PROAIR) 108 (90 Base) MCG/ACT inhaler Inhale 2 puffs into the lungs every 6 hours as needed for Wheezing    [provider]   omeprazole (PRILOSEC) 20 MG delayed release capsule Take 1 capsule by mouth daily  [provider]        Allergies     Allergies   Allergen Reactions    Aspirin Other (See Comments)     Other reaction(s): PT REPORTS INTERNAL BLEEDING    Iodinated Contrast Media Hives    Onion Hives    Onion Extract Hives    Penicillins      Other reaction(s): Not Reported This Time       Physical Exam/ED Course / Medical Decision Making   Patient Vitals for the past 24 hrs:   Temp Pulse Resp BP SpO2   10/24/22 2015 -- 81 27 134/61 95 %   10/24/22 1952 98.6 F (37 C) 78 18 (!) 149/74 100 %     Physical exam:  General: Well-developed, well-nourished, not warm to touch nondiaphoretic.    Head: Normocephalic atraumatic.    Eyes: Pupils midrange extraocular movements intact.  No pallor or conjunctival injection.    Nose: No rhinorrhea, inspection grossly normal.    Ears: Grossly normal to inspection, no discharge.    Mouth: Mucous membranes moist, somewhat muffled voice, no clear asymmetry of the posterior pharyngeal structures.    Neck/Back: Trachea midline, no asymmetry.    Chest: Grossly normal inspection, symmetric chest rise.    Pulmonary: Clear to  auscultation bilaterally no wheezes rhonchi or rales.    Cardiovascular: S1-S2 no murmurs rubs or gallops.    Abdomen: Soft, nontender, nondistended no guarding rebound or peritoneal signs.    Extremities: Grossly normal to inspection, peripheral pulses intact  Neurologic: Alert and oriented no appreciable focal neurologic deficit  Skin: Hives are noted    Nursing note reviewed, vital signs reviewed.    ED course:  I was called emergently to bed 24.  She has airway symptoms, exposure to allergen.  Will treat for anaphylaxis.  given epinephrine , Benadryl  Solu-Medrol  Pepcid  will monitor.    Differential diagnosis: As above    Reevaluate after 2 hours.  Patient wishes to be discharged home.  She has complete resolve symptoms.  Discussed with patient and her daughter who will be staying with her possibility for recurrent symptoms of recurrent anaphylaxis.  She does not want to be observed in the hospital any longer, she will be discharged with EpiPen , she has Benadryl  at home, return precautions    Patient's presentation, history, physical exam and laboratory evaluations were reviewed.  At this time patient was felt to be stable for outpatient management and follow with primary care/specialist.  Patient was instructed to return to the emergency department with any concerns.    Disposition:    Discharged home      Please note that this dictation was completed with Dragon, the computer voice recognition software.  Quite often unanticipated grammatical, syntax, homophones, and other interpretive errors are inadvertently transcribed by the computer software.  Please disregard these errors.  Please excuse any errors that have escaped final proofreading.    I have spent 35 minutes of critical care time (excluding time for other separate services) involved in lab review, consultations with specialist, family decision-making, and documentation.  During this entire length of time I was immediately available to the  patient.    Critical Care:  The reason for providing this level of medical care for this critically ill patient was due a critical illness that impaired one or more vital organ systems such that there was a high probability of imminent or life threatening deterioration in the patients condition. This care involved high complexity decision making to  assess, manipulate, and support vital system functions, to treat this degree of vital organ system failure and to prevent further life threatening deterioration of the patient's condition.         ED Course as of 10/25/22 0123   Sat Oct 24, 2022   2034 Reevaluated: Improved subjectively.  Tells me her name in a clear voice [CS]      ED Course User Index  [CS] Walden Lorrene HERO, MD       RECORDS REVIEWED:  I reviewed the patient's previous records here at [facility] and available outside facilities and note that   -       EXTERNAL RESULTS REVIEWED:    -      INDEPENDENT HISTORIAN:  History and/or plan development assisted by:   -      Severe exacerbation or progression of chronic illness:    -      Threat to body function without evaluation and management:    -Anaphylaxis acute threat to life    SOCIAL DETERMINANTS  impacting Evaluation and Management:   -      Comorbidities impacting Evaluation and Management:   -History of anaphylaxis, exposure to known allergen    Final Diagnosis       ICD-10-CM    1. 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    Imaging:    No orders to display                 New medications  New Prescriptions    EPINEPHRINE  (EPIPEN  2-PAK) 0.3 MG/0.3ML SOAJ INJECTION    Inject 0.3 mLs into the muscle once for 1 dose Use as directed for allergic reaction Hold on thigh for 10 seconds to complete injection          Results of lab/radiology tests were discussed with the patient. . All questions  were answered and concerns addressed.      Lorrene Walden, MD    My signature above authenticates this document and my orders, the final    diagnosis (es), discharge prescription (s), and instructions in the Epic record.  If you have any questions please contact [phone].                       Walden Lorrene HERO, MD  10/25/22 [phone]

## 2022-10-24 NOTE — ED Triage Notes (Signed)
 Pt presents to ED c/o allergic reaction to onions. Pt states accidentally ate one 30 mins PTA. Pt has hx of anaphylaxis to onions. Pt did not have epi pen on her. Pt has hives noted to body. Pt states tingling around lips and throat. Pt states took half a benadryl  before coming in.

## 2022-10-25 MED ORDER — EPINEPHRINE 0.3 MG/0.3ML IJ SOAJ
0.3 | Freq: Once | INTRAMUSCULAR | 0 refills | Status: AC
Start: 2022-10-25 — End: 2022-10-24

## 2022-10-25 MED ORDER — SODIUM CHLORIDE (PF) 0.9 % IJ SOLN
0.9 | INTRAMUSCULAR | Status: AC
Start: 2022-10-25 — End: 2022-10-24

## 2022-10-25 MED ORDER — METHYLPREDNISOLONE SODIUM SUCC 125 MG IJ SOLR (MIXTURES ONLY)
125 | INTRAMUSCULAR | Status: AC
Start: 2022-10-25 — End: 2022-10-24

## 2022-10-25 MED ORDER — DIPHENHYDRAMINE HCL 50 MG/ML IJ SOLN
50 | INTRAMUSCULAR | Status: AC
Start: 2022-10-25 — End: 2022-10-24

## 2022-10-25 MED ORDER — ONDANSETRON HCL 4 MG/2ML IJ SOLN
4 | Freq: Once | INTRAMUSCULAR | Status: AC
Start: 2022-10-25 — End: 2022-10-24

## 2022-10-25 MED ADMIN — diphenhydrAMINE (BENADRYL) injection 50 mg: 50 mg | INTRAVENOUS | NDC 63323066400

## 2022-10-25 MED ADMIN — famotidine (PEPCID) 20 mg in sodium chloride (PF) 0.9 % 10 mL injection: 20 mg | INTRAVENOUS | NDC 63323073911

## 2022-10-25 MED ADMIN — ondansetron (ZOFRAN) injection 4 mg: 4 mg | INTRAVENOUS | NDC 60505613000

## 2022-10-25 MED ADMIN — methylPREDNISolone sodium (SOLU-MEDROL) injection 125 mg: 125 mg | INTRAVENOUS | NDC 00009004725

## 2022-10-25 MED ADMIN — EPINEPHrine PF 1 MG/ML injection 0.3 mg: 0.3 mg | INTRAMUSCULAR | NDC 54288010301

## 2022-10-25 MED FILL — EPINEPHRINE PF 1 MG/ML IJ SOLN: 1 MG/ML | INTRAMUSCULAR | Qty: 1

## 2022-10-25 MED FILL — SOLU-MEDROL (PF) 125 MG IJ SOLR: 125 MG | INTRAMUSCULAR | Qty: 125

## 2022-10-25 MED FILL — FAMOTIDINE (PF) 20 MG/2ML IV SOLN: 20 MG/2ML | INTRAVENOUS | Qty: 2

## 2022-10-25 MED FILL — DIPHENHYDRAMINE HCL 50 MG/ML IJ SOLN: 50 MG/ML | INTRAMUSCULAR | Qty: 1

## 2022-10-25 MED FILL — ONDANSETRON HCL 4 MG/2ML IJ SOLN: 4 MG/2ML | INTRAMUSCULAR | Qty: 2

## 2023-11-09 ENCOUNTER — Ambulatory Visit: Payer: BLUE CROSS/BLUE SHIELD | Primary: Internal Medicine

## 2024-01-04 ENCOUNTER — Ambulatory Visit: Payer: BLUE CROSS/BLUE SHIELD | Primary: Internal Medicine

## 2024-02-01 ENCOUNTER — Inpatient Hospital Stay: Payer: BLUE CROSS/BLUE SHIELD | Attending: Specialist | Primary: Internal Medicine
